# Patient Record
Sex: Female | Born: 1985 | Race: White | Hispanic: No | Marital: Married | Smoking: Never smoker
Health system: Southern US, Community
[De-identification: ages and names within clinical notes are randomized; demographics above are authoritative.]

## PROBLEM LIST (undated history)

## (undated) DIAGNOSIS — T7840XA Allergy, unspecified, initial encounter: Secondary | ICD-10-CM

## (undated) DIAGNOSIS — Z8619 Personal history of other infectious and parasitic diseases: Secondary | ICD-10-CM

## (undated) DIAGNOSIS — F419 Anxiety disorder, unspecified: Secondary | ICD-10-CM

## (undated) HISTORY — DX: Personal history of other infectious and parasitic diseases: Z86.19

## (undated) HISTORY — DX: Allergy, unspecified, initial encounter: T78.40XA

## (undated) HISTORY — DX: Anxiety disorder, unspecified: F41.9

---

## 2007-07-12 HISTORY — PX: WISDOM TOOTH EXTRACTION: SHX21

## 2013-04-17 ENCOUNTER — Ambulatory Visit (INDEPENDENT_AMBULATORY_CARE_PROVIDER_SITE_OTHER): Payer: BC Managed Care – PPO | Admitting: Obstetrics & Gynecology

## 2013-04-17 ENCOUNTER — Ambulatory Visit: Payer: Self-pay | Admitting: Obstetrics and Gynecology

## 2013-04-17 ENCOUNTER — Encounter: Payer: Self-pay | Admitting: Obstetrics & Gynecology

## 2013-04-17 VITALS — BP 126/83 | HR 91 | Resp 16 | Ht 66.0 in | Wt 132.0 lb

## 2013-04-17 DIAGNOSIS — Z124 Encounter for screening for malignant neoplasm of cervix: Secondary | ICD-10-CM

## 2013-04-17 DIAGNOSIS — Z01419 Encounter for gynecological examination (general) (routine) without abnormal findings: Secondary | ICD-10-CM | POA: Insufficient documentation

## 2013-04-17 DIAGNOSIS — N912 Amenorrhea, unspecified: Secondary | ICD-10-CM

## 2013-04-17 LAB — POCT URINE PREGNANCY: Preg Test, Ur: NEGATIVE

## 2013-04-17 NOTE — Progress Notes (Signed)
  Subjective:     Angelica Dickerson is a 27 y.o. female here for a routine exam.  Current complaints: trying to become prgnant since May.  Menses is 1 week late.  UPT is negative today.  Will order beta.  Personal health questionnaire reviewed: yes.   Gynecologic History Patient's last menstrual period was 03/10/2013. Contraception: none Last Pap: 2013. Results were: normal per pt Last mammogram:   Obstetric History OB History  Gravida Para Term Preterm AB SAB TAB Ectopic Multiple Living  0 0 0 0 0 0 0 0 0 0          The following portions of the patient's history were reviewed and updated as appropriate: allergies, current medications, past family history, past medical history, past social history, past surgical history and problem list.  Review of Systems Pertinent items are noted in HPI.    Objective:      Filed Vitals:   04/17/13 1053  BP: 126/83  Pulse: 91  Resp: 16  Height: 5\' 6"  (1.676 m)  Weight: 132 lb (59.875 kg)   Vitals:  WNL General appearance: alert, cooperative and no distress Head: Normocephalic, without obvious abnormality, atraumatic Eyes: negative Throat: lips, mucosa, and tongue normal; teeth and gums normal Lungs: clear to auscultation bilaterally Breasts: normal appearance, no masses or tenderness, No nipple retraction or dimpling, No nipple discharge or bleeding Heart: regular rate and rhythm Abdomen: soft, non-tender; bowel sounds normal; no masses,  no organomegaly Pelvic: cervix normal in appearance, external genitalia normal, no adnexal masses or tenderness, no bladder tenderness, no cervical motion tenderness, perianal skin: no external genital warts noted, urethra without abnormality or discharge, uterus normal size, shape, and consistency and vagina normal without discharge Extremities: no edema, redness or tenderness in the calves or thighs Skin: no lesions or rash Lymph nodes: Axillary adenopathy: none         Assessment:    Healthy  female exam.    Plan:    Education reviewed: self breast exams and skin cancer screening. Contraception: none. Follow up in: 1 year. PNV Pap.  Next pap due in 3 years. RTC in not pregnant in 6 months of trying to conceive. Beta today.

## 2013-04-18 ENCOUNTER — Telehealth: Payer: Self-pay | Admitting: *Deleted

## 2013-04-18 NOTE — Telephone Encounter (Signed)
Called pt to adv Quant was neg - pt to return for follow up if not pregnant in 6 months. Pt had no questions and understood test results.

## 2013-07-11 NOTE — L&D Delivery Note (Signed)
Delivery Note At 4:06 AM a viable and healthy female was delivered via Vaginal, Spontaneous Delivery (Presentation: ; Occiput Anterior).  APGAR: 8, 9; weight 7lbs 3 ozs. Placenta status: Intact, Spontaneous.  Cord: 3 vessels with the following complications: None.    Anesthesia: None  Episiotomy: None Lacerations: 2nd degree;Perineal Suture Repair: 3.0 vicryl on CT1 Est. Blood Loss (mL): 300  Mom to postpartum.  Baby to Couplet care / Skin to Skin.  ADAMS,SHNIQUAL SHWON 05/31/2014, 5:23 AM  I was present for the entire delivery and I agree with the above. Cam HaiSHAW, Kellyn Mccary CNM 9:30 AM 05/31/2014

## 2013-11-01 ENCOUNTER — Ambulatory Visit (INDEPENDENT_AMBULATORY_CARE_PROVIDER_SITE_OTHER): Payer: PRIVATE HEALTH INSURANCE | Admitting: Family

## 2013-11-01 ENCOUNTER — Encounter: Payer: Self-pay | Admitting: Family

## 2013-11-01 VITALS — BP 123/83 | HR 86 | Wt 124.0 lb

## 2013-11-01 DIAGNOSIS — Z3490 Encounter for supervision of normal pregnancy, unspecified, unspecified trimester: Secondary | ICD-10-CM

## 2013-11-01 DIAGNOSIS — Z3480 Encounter for supervision of other normal pregnancy, unspecified trimester: Secondary | ICD-10-CM

## 2013-11-01 NOTE — Progress Notes (Signed)
Pt here for first OB appt - 1st pregnancy - nrml PAP 04/2013

## 2013-11-01 NOTE — Progress Notes (Signed)
Will be in [place] 18-26 weeks, will return in September, plans to birth with the practice.    Subjective:    Angelica Dickerson is a G1P0000 5546w0d being seen today for her first obstetrical visit.   Desires midwifery care in her pregnancy. Discussed that we also utilize fellows and she was okay with one attending her birth.  Recently discovered that she's moving to [place].  Plans to go at about 19 wks until she's [redacted] weeks pregnant, will obtain care during that time.  Plans to return at 26 weeks and remain here until after the baby is born.  Patient does intend to breast feed. Pregnancy history fully reviewed.    Patient reports no bleeding and no cramping.  Filed Vitals:   11/01/13 0940  BP: 123/83  Pulse: 86  Weight: 124 lb (56.246 kg)    HISTORY: OB History  Gravida Para Term Preterm AB SAB TAB Ectopic Multiple Living  1 0 0 0 0 0 0 0 0 0     # Outcome Date GA Lbr Len/2nd Weight Sex Delivery Anes PTL Lv  1 CUR              History reviewed. No pertinent past medical history. Past Surgical History  Procedure Laterality Date  . Wisdom tooth extraction  2009   Family History  Problem Relation Age of Onset  . Diabetes Paternal Grandfather   . Hyperlipidemia Father   . Hyperlipidemia Paternal Grandfather      Exam   Exam   BP 123/83  Pulse 86  Wt 124 lb (56.246 kg)  LMP 08/23/2013 Uterine Size: size equals dates  Pelvic Exam:    Perineum: No Hemorrhoids, Normal Perineum   Vulva: normal   Vagina:  normal mucosa, normal discharge, no palpable nodules   pH: Not done   Cervix: no bleeding, no cervical motion tenderness and no lesions   Adnexa: normal adnexa and no mass, fullness, tenderness   Bony Pelvis: Adequate  System: Breast:  No nipple retraction or dimpling, No nipple discharge or bleeding, No axillary or supraclavicular adenopathy, Normal to palpation without dominant masses   Skin: normal coloration and turgor, no rashes    Neurologic: negative   Extremities: normal strength, tone, and muscle mass   HEENT neck supple with midline trachea and thyroid without masses   Mouth/Teeth mucous membranes moist, pharynx normal without lesions   Neck supple and no masses   Cardiovascular: regular rate and rhythm, no murmurs or gallops   Respiratory:  appears well, vitals normal, no respiratory distress, acyanotic, normal RR, neck free of mass or lymphadenopathy, chest clear, no wheezing, crepitations, rhonchi, normal symmetric air entry   Abdomen: soft, non-tender; bowel sounds normal; no masses,  no organomegaly   Urinary: urethral meatus normal     Assessment:    Pregnancy:  28 yo G1P0000 at 5546w0d wk by certain LMP  Patient Active Problem List   Diagnosis Date Noted  . Supervision of normal pregnancy 11/01/2013  . Routine gynecological examination 04/17/2013        Plan:     Initial labs drawn. Prenatal vitamins. Problem list reviewed and updated. Genetic Screening discussed; undecided at this time.  Follow up in 4 weeks.   Eino FarberWalidah Paul HalfN Muhammad 11/01/2013

## 2013-11-02 ENCOUNTER — Encounter: Payer: Self-pay | Admitting: Family

## 2013-11-02 DIAGNOSIS — Z2839 Other underimmunization status: Secondary | ICD-10-CM | POA: Insufficient documentation

## 2013-11-02 DIAGNOSIS — Z283 Underimmunization status: Secondary | ICD-10-CM | POA: Insufficient documentation

## 2013-11-02 DIAGNOSIS — O9989 Other specified diseases and conditions complicating pregnancy, childbirth and the puerperium: Secondary | ICD-10-CM

## 2013-11-02 LAB — OBSTETRIC PANEL
Antibody Screen: NEGATIVE
BASOS ABS: 0 10*3/uL (ref 0.0–0.1)
Basophils Relative: 0 % (ref 0–1)
Eosinophils Absolute: 0 10*3/uL (ref 0.0–0.7)
Eosinophils Relative: 0 % (ref 0–5)
HCT: 39.4 % (ref 36.0–46.0)
HEP B S AG: NEGATIVE
Hemoglobin: 13.8 g/dL (ref 12.0–15.0)
Lymphocytes Relative: 19 % (ref 12–46)
Lymphs Abs: 1.7 10*3/uL (ref 0.7–4.0)
MCH: 30.4 pg (ref 26.0–34.0)
MCHC: 35 g/dL (ref 30.0–36.0)
MCV: 86.8 fL (ref 78.0–100.0)
Monocytes Absolute: 0.4 10*3/uL (ref 0.1–1.0)
Monocytes Relative: 4 % (ref 3–12)
NEUTROS ABS: 6.9 10*3/uL (ref 1.7–7.7)
NEUTROS PCT: 77 % (ref 43–77)
Platelets: 207 10*3/uL (ref 150–400)
RBC: 4.54 MIL/uL (ref 3.87–5.11)
RDW: 13.9 % (ref 11.5–15.5)
RUBELLA: 0.81 {index} (ref <0.90)
Rh Type: POSITIVE
WBC: 8.9 10*3/uL (ref 4.0–10.5)

## 2013-11-02 LAB — GC/CHLAMYDIA PROBE AMP
CT Probe RNA: NEGATIVE
GC Probe RNA: NEGATIVE

## 2013-11-02 LAB — HIV ANTIBODY (ROUTINE TESTING W REFLEX): HIV: NONREACTIVE

## 2013-11-04 LAB — CULTURE, OB URINE

## 2013-11-09 ENCOUNTER — Other Ambulatory Visit: Payer: Self-pay | Admitting: Family

## 2013-11-09 ENCOUNTER — Encounter: Payer: Self-pay | Admitting: Family

## 2013-11-09 MED ORDER — ERYTHROMYCIN BASE 333 MG PO TBEC: 333.0000 mg | DELAYED_RELEASE_TABLET | Freq: Three times a day (TID) | ORAL | Status: DC

## 2013-11-09 NOTE — Progress Notes (Signed)
Left message on voicemail for patient to call office.  When call is returned inform her that she has a UTI and [place] [facility] [place]

## 2013-11-29 ENCOUNTER — Ambulatory Visit (INDEPENDENT_AMBULATORY_CARE_PROVIDER_SITE_OTHER): Payer: PRIVATE HEALTH INSURANCE | Admitting: Family

## 2013-11-29 VITALS — BP 132/78 | HR 100 | Wt 124.0 lb

## 2013-11-29 DIAGNOSIS — Z3480 Encounter for supervision of other normal pregnancy, unspecified trimester: Secondary | ICD-10-CM

## 2013-11-29 DIAGNOSIS — Z3490 Encounter for supervision of normal pregnancy, unspecified, unspecified trimester: Secondary | ICD-10-CM

## 2013-11-29 NOTE — Progress Notes (Signed)
Right hip pain; no vaginal bleeding, no cramping or bleeding.  Stretching has improved.  Recommended pregnancy yoga.  Considering waterbirth.  Explained needing to take waterbirth class at [facility].  Schedule anatomy ultrasound in 4 weeks.

## 2013-11-29 NOTE — Progress Notes (Signed)
Pt c/o right hip pain radiating to lower back - worse with sitting - pain has been relieved some by swimming and stretching

## 2013-12-20 ENCOUNTER — Telehealth: Payer: Self-pay | Admitting: *Deleted

## 2013-12-20 NOTE — Telephone Encounter (Signed)
Pt called in states she has a bright pink rash that recently came up. She states she itches a lot. She is not sure what the cause is for the rash. I adv pt to report to [facility] for eval and that the rash would need to be seen before a medication could be sent to pharm - pt expressed understanding.

## 2013-12-27 ENCOUNTER — Ambulatory Visit (INDEPENDENT_AMBULATORY_CARE_PROVIDER_SITE_OTHER): Payer: PRIVATE HEALTH INSURANCE | Admitting: Advanced Practice Midwife

## 2013-12-27 ENCOUNTER — Ambulatory Visit (HOSPITAL_COMMUNITY)
Admission: RE | Admit: 2013-12-27 | Discharge: 2013-12-27 | Disposition: A | Discharge status: Home / Self Care | Payer: PRIVATE HEALTH INSURANCE | Source: Ambulatory Visit | Attending: Family | Admitting: Family

## 2013-12-27 ENCOUNTER — Other Ambulatory Visit: Payer: Self-pay | Admitting: Family

## 2013-12-27 VITALS — BP 131/87 | HR 95 | Wt 127.0 lb

## 2013-12-27 DIAGNOSIS — O2390 Unspecified genitourinary tract infection in pregnancy, unspecified trimester: Secondary | ICD-10-CM

## 2013-12-27 DIAGNOSIS — Z3490 Encounter for supervision of normal pregnancy, unspecified, unspecified trimester: Secondary | ICD-10-CM

## 2013-12-27 DIAGNOSIS — B951 Streptococcus, group B, as the cause of diseases classified elsewhere: Secondary | ICD-10-CM

## 2013-12-27 DIAGNOSIS — O2340 Unspecified infection of urinary tract in pregnancy, unspecified trimester: Secondary | ICD-10-CM

## 2013-12-27 DIAGNOSIS — O2341 Unspecified infection of urinary tract in pregnancy, first trimester: Principal | ICD-10-CM

## 2013-12-27 DIAGNOSIS — N390 Urinary tract infection, site not specified: Secondary | ICD-10-CM

## 2013-12-27 DIAGNOSIS — Z3480 Encounter for supervision of other normal pregnancy, unspecified trimester: Secondary | ICD-10-CM

## 2013-12-27 DIAGNOSIS — Z3689 Encounter for other specified antenatal screening: Secondary | ICD-10-CM | POA: Insufficient documentation

## 2013-12-27 NOTE — Patient Instructions (Signed)
Second Trimester of Pregnancy The second trimester is from week 13 through week 28, months 4 through 6. The second trimester is often a time when you feel your best. Your body has also adjusted to being pregnant, and you begin to feel better physically. Usually, morning sickness has lessened or quit completely, you may have more energy, and you may have an increase in appetite. The second trimester is also a time when the fetus is growing rapidly. At the end of the sixth month, the fetus is about 9 inches long and weighs about 1 pounds. You will likely begin to feel the baby move (quickening) between 18 and 20 weeks of the pregnancy. BODY CHANGES Your body goes through many changes during pregnancy. The changes vary from woman to woman.   Your weight will continue to increase. You will notice your lower abdomen bulging out.  You may begin to get stretch marks on your hips, abdomen, and breasts.  You may develop headaches that can be relieved by medicines approved by your health care Angelica Dickerson.  You may urinate more often because the fetus is pressing on your bladder.  You may develop or continue to have heartburn as a result of your pregnancy.  You may develop constipation because certain hormones are causing the muscles that push waste through your intestines to slow down.  You may develop hemorrhoids or swollen, bulging veins (varicose veins).  You may have back pain because of the weight gain and pregnancy hormones relaxing your joints between the bones in your pelvis and as a result of a shift in weight and the muscles that support your balance.  Your breasts will continue to grow and be tender.  Your gums may bleed and may be sensitive to brushing and flossing.  Dark spots or blotches (chloasma, mask of pregnancy) may develop on your face. This will likely fade after the baby is born.  A dark line from your belly button to the pubic area (linea nigra) may appear. This will likely fade  after the baby is born.  You may have changes in your hair. These can include thickening of your hair, rapid growth, and changes in texture. Some women also have hair loss during or after pregnancy, or hair that feels dry or thin. Your hair will most likely return to normal after your baby is born. WHAT TO EXPECT AT YOUR PRENATAL VISITS During a routine prenatal visit:  You will be weighed to make sure you and the fetus are growing normally.  Your blood pressure will be taken.  Your abdomen will be measured to track your baby's growth.  The fetal heartbeat will be listened to.  Any test results from the previous visit will be discussed. Your health care Angelica Dickerson may ask you:  How you are feeling.  If you are feeling the baby move.  If you have had any abnormal symptoms, such as leaking fluid, bleeding, severe headaches, or abdominal cramping.  If you have any questions. Other tests that may be performed during your second trimester include:  Blood tests that check for:  Low iron levels (anemia).  Gestational diabetes (between 24 and 28 weeks).  Rh antibodies.  Urine tests to check for infections, diabetes, or protein in the urine.  An ultrasound to confirm the proper growth and development of the baby.  An amniocentesis to check for possible genetic problems.  Fetal screens for spina bifida and Down syndrome. HOME CARE INSTRUCTIONS   Avoid all smoking, herbs, alcohol, and unprescribed   drugs. These chemicals affect the formation and growth of the baby.  Follow your health care Angelica Dickerson's instructions regarding medicine use. There are medicines that are either safe or unsafe to take during pregnancy.  Exercise only as directed by your health care Angelica Dickerson. Experiencing uterine cramps is a good sign to stop exercising.  Continue to eat regular, healthy meals.  Wear a good support bra for breast tenderness.  Do not use hot tubs, steam rooms, or saunas.  Wear your  seat belt at all times when driving.  Avoid raw meat, uncooked cheese, cat litter boxes, and soil used by cats. These carry germs that can cause birth defects in the baby.  Take your prenatal vitamins.  Try taking a stool softener (if your health care Angelica Dickerson approves) if you develop constipation. Eat more high-fiber foods, such as fresh vegetables or fruit and whole grains. Drink plenty of fluids to keep your urine clear or pale yellow.  Take warm sitz baths to soothe any pain or discomfort caused by hemorrhoids. Use hemorrhoid cream if your health care Angelica Dickerson approves.  If you develop varicose veins, wear support hose. Elevate your feet for 15 minutes, 3-4 times a day. Limit salt in your diet.  Avoid heavy lifting, wear low heel shoes, and practice good posture.  Rest with your legs elevated if you have leg cramps or low back pain.  Visit your dentist if you have not gone yet during your pregnancy. Use a soft toothbrush to brush your teeth and be gentle when you floss.  A sexual relationship may be continued unless your health care Angelica Dickerson directs you otherwise.  Continue to go to all your prenatal visits as directed by your health care Angelica Dickerson. SEEK MEDICAL CARE IF:   You have dizziness.  You have mild pelvic cramps, pelvic pressure, or nagging pain in the abdominal area.  You have persistent nausea, vomiting, or diarrhea.  You have a bad smelling vaginal discharge.  You have pain with urination. SEEK IMMEDIATE MEDICAL CARE IF:   You have a fever.  You are leaking fluid from your vagina.  You have spotting or bleeding from your vagina.  You have severe abdominal cramping or pain.  You have rapid weight gain or loss.  You have shortness of breath with chest pain.  You notice sudden or extreme swelling of your face, hands, ankles, feet, or legs.  You have not felt your baby move in over an hour.  You have severe headaches that do not go away with  medicine.  You have vision changes. Document Released: 06/21/2001 Document Revised: 07/02/2013 Document Reviewed: 08/28/2012 ExitCare Patient Information 2015 [employer]. This information is not intended to replace advice given to you by your health care Angelica Dickerson. Make sure you discuss any questions you have with your health care Angelica Dickerson.  

## 2013-12-27 NOTE — Progress Notes (Signed)
Anatomy US done today, not read yet. States tech told her the ventricles might be small. Going to [place] next week. Will leave husband's phone number. If needs extra US, may get it there or here in Sept when returns. Has taken waterbirth class. Certificate scanned today. Travel precautions reviewed.

## 2013-12-30 ENCOUNTER — Encounter: Payer: Self-pay | Admitting: Family

## 2013-12-30 ENCOUNTER — Telehealth: Payer: Self-pay | Admitting: *Deleted

## 2013-12-30 NOTE — Telephone Encounter (Signed)
Message copied by Arne ClevelandHUTCHINSON, MANDY J on Mon Dec 30, 2013  3:00 PM ------      Message from: Aviva SignsWILLIAMS, MARIE L      Created: Fri Dec 27, 2013 11:47 AM      Regarding: GBS UTI       Not clear if this UTI ever got treated.            If not needs ampicillin 500mg  qid x 7 d            Hilda LiasMarie ------

## 2013-12-30 NOTE — Telephone Encounter (Signed)
Error

## 2014-01-01 ENCOUNTER — Encounter: Payer: Self-pay | Admitting: Obstetrics & Gynecology

## 2014-01-01 ENCOUNTER — Telehealth: Payer: Self-pay | Admitting: *Deleted

## 2014-01-01 DIAGNOSIS — A491 Streptococcal infection, unspecified site: Secondary | ICD-10-CM

## 2014-01-01 MED ORDER — CLINDAMYCIN HCL 300 MG PO CAPS: 300.0000 mg | ORAL_CAPSULE | Freq: Four times a day (QID) | ORAL | Status: DC

## 2014-01-01 NOTE — Telephone Encounter (Signed)
Called pt but can't leave message - sent script for GBS UTI to pharm

## 2014-01-01 NOTE — Telephone Encounter (Signed)
Message copied by HUTCHINSON, MANDY J on Wed Jan 01, 2014 10:47 AM ------      Message from: Aviva SigArne ClevelandnsWILLIAMS, MARIE L      Created: Mon Dec 30, 2013  4:54 PM      Regarding: RE: GBS UTI       Since it is GBS, then Clindamycin 300mg  q 6 hrs  X 7 days            Amp is too close to [facility]                        ----- Message -----         From: Arne ClevelandMandy J Hutchinson, CMA         Sent: 12/30/2013   3:16 PM           To: Blossom HoopsLora Lee Clark, RN, Aviva SignsMarie L Williams, CNM      Subject: RE: GBS UTI                                              Pt is allergic to Amoxicillin, do you want to call in ampicillin or something else?             Thanks      MH                  ----- Message -----         From: Aviva SignsMarie L Williams, CNM         Sent: 12/27/2013  11:47 AM           To: Arne ClevelandMandy J Hutchinson, CMA      Subject: GBS UTI                                                  Not clear if this UTI ever got treated.            If not needs ampicillin 500mg  qid x 7 d            Hilda LiasMarie             ------

## 2014-01-02 ENCOUNTER — Other Ambulatory Visit: Payer: Self-pay | Admitting: *Deleted

## 2014-01-02 DIAGNOSIS — A491 Streptococcal infection, unspecified site: Secondary | ICD-10-CM

## 2014-01-02 MED ORDER — CLINDAMYCIN HCL 300 MG PO CAPS: 300.0000 mg | ORAL_CAPSULE | Freq: Four times a day (QID) | ORAL | Status: DC

## 2014-01-02 NOTE — Telephone Encounter (Signed)
Pt is in [place] until September and is requesting her RX for Clindamycin be emailed to her.

## 2014-03-24 ENCOUNTER — Ambulatory Visit (INDEPENDENT_AMBULATORY_CARE_PROVIDER_SITE_OTHER): Payer: No Typology Code available for payment source | Admitting: Family

## 2014-03-24 ENCOUNTER — Encounter: Payer: Self-pay | Admitting: Family

## 2014-03-24 VITALS — BP 109/68 | HR 81 | Wt 133.8 lb

## 2014-03-24 DIAGNOSIS — Z3480 Encounter for supervision of other normal pregnancy, unspecified trimester: Secondary | ICD-10-CM

## 2014-03-24 DIAGNOSIS — Z3493 Encounter for supervision of normal pregnancy, unspecified, third trimester: Secondary | ICD-10-CM

## 2014-03-24 NOTE — Progress Notes (Signed)
While in [place] was told she had some cervical shortening from 36cm to 27cm and has held at the 27cm point since.  She also had her glucose testing done while there, they did a three hour and was told it was normal as well.  She did bring these results back with her.

## 2014-03-24 NOTE — Progress Notes (Signed)
Recently returned from [place].  Reported that transvaginal and abdominal ultrasound done at every visit there.  Informed that had short cervix 2.7 cm, not put on meds.  Cervix closed, no history of contractions or vaginal bleedign.  RPR, CBC, and HIV obtained.  2 hr in [place] normal [phone] (brought records).  Report any signs of preterm labor.

## 2014-03-25 LAB — CBC
HEMATOCRIT: 35.7 % — AB (ref 36.0–46.0)
Hemoglobin: 11.9 g/dL — ABNORMAL LOW (ref 12.0–15.0)
MCH: 31.2 pg (ref 26.0–34.0)
MCHC: 33.3 g/dL (ref 30.0–36.0)
MCV: 93.5 fL (ref 78.0–100.0)
Platelets: 141 10*3/uL — ABNORMAL LOW (ref 150–400)
RBC: 3.82 MIL/uL — AB (ref 3.87–5.11)
RDW: 13.8 % (ref 11.5–15.5)
WBC: 8.4 10*3/uL (ref 4.0–10.5)

## 2014-03-25 LAB — RPR

## 2014-03-25 LAB — HIV ANTIBODY (ROUTINE TESTING W REFLEX): HIV 1&2 Ab, 4th Generation: NONREACTIVE

## 2014-04-09 ENCOUNTER — Ambulatory Visit (INDEPENDENT_AMBULATORY_CARE_PROVIDER_SITE_OTHER): Payer: No Typology Code available for payment source | Admitting: Obstetrics & Gynecology

## 2014-04-09 VITALS — BP 115/68 | HR 84 | Wt 139.0 lb

## 2014-04-09 DIAGNOSIS — Z23 Encounter for immunization: Secondary | ICD-10-CM

## 2014-04-09 DIAGNOSIS — Z3493 Encounter for supervision of normal pregnancy, unspecified, third trimester: Secondary | ICD-10-CM

## 2014-04-09 DIAGNOSIS — Z3480 Encounter for supervision of other normal pregnancy, unspecified trimester: Secondary | ICD-10-CM

## 2014-04-09 MED ORDER — INFLUENZA VAC SPLIT QUAD 0.5 ML IM SUSY
0.5000 mL | PREFILLED_SYRINGE | Freq: Once | INTRAMUSCULAR | Status: AC
  Administered 2014-04-09: 0.5 mL via INTRAMUSCULAR

## 2014-04-09 NOTE — Progress Notes (Signed)
Flu shot this week.  Tdap next visit.  Size less than dates.  Will get [place].  Wants water birth.  Has taken the class.

## 2014-04-09 NOTE — Addendum Note (Signed)
Addended by: Granville LewisLARK, Della Homan L on: 04/09/2014 09:08 AM   Modules accepted: Orders

## 2014-04-10 ENCOUNTER — Ambulatory Visit (HOSPITAL_COMMUNITY)
Admission: RE | Admit: 2014-04-10 | Discharge: 2014-04-10 | Disposition: A | Discharge status: Home / Self Care | Payer: 59 | Source: Ambulatory Visit | Attending: Obstetrics & Gynecology | Admitting: Obstetrics & Gynecology

## 2014-04-10 DIAGNOSIS — Z3A32 32 weeks gestation of pregnancy: Secondary | ICD-10-CM

## 2014-04-10 DIAGNOSIS — Z3493 Encounter for supervision of normal pregnancy, unspecified, third trimester: Secondary | ICD-10-CM | POA: Insufficient documentation

## 2014-04-10 DIAGNOSIS — Z0489 Encounter for examination and observation for other specified reasons: Secondary | ICD-10-CM

## 2014-04-10 DIAGNOSIS — O365930 Maternal care for other known or suspected poor fetal growth, third trimester, not applicable or unspecified: Secondary | ICD-10-CM

## 2014-04-10 DIAGNOSIS — IMO0002 Reserved for concepts with insufficient information to code with codable children: Secondary | ICD-10-CM

## 2014-04-21 ENCOUNTER — Ambulatory Visit (INDEPENDENT_AMBULATORY_CARE_PROVIDER_SITE_OTHER): Payer: 59 | Admitting: Advanced Practice Midwife

## 2014-04-21 ENCOUNTER — Encounter: Payer: Self-pay | Admitting: *Deleted

## 2014-04-21 VITALS — BP 131/76 | HR 83 | Wt 139.0 lb

## 2014-04-21 DIAGNOSIS — Z3480 Encounter for supervision of other normal pregnancy, unspecified trimester: Secondary | ICD-10-CM

## 2014-04-21 DIAGNOSIS — Z23 Encounter for immunization: Secondary | ICD-10-CM | POA: Diagnosis not present

## 2014-04-21 MED ORDER — TETANUS-DIPHTH-ACELL PERTUSSIS 5-2.5-18.5 LF-MCG/0.5 IM SUSP: 0.5000 mL | Freq: Once | INTRAMUSCULAR | Status: DC

## 2014-04-21 NOTE — Progress Notes (Signed)
Doing well.  Good fetal movement, denies vaginal bleeding, LOF, regular contractions.  Waterbirth consent/research study discussed and signed today.

## 2014-05-05 ENCOUNTER — Ambulatory Visit (INDEPENDENT_AMBULATORY_CARE_PROVIDER_SITE_OTHER): Payer: 59 | Admitting: Advanced Practice Midwife

## 2014-05-05 ENCOUNTER — Other Ambulatory Visit: Payer: Self-pay | Admitting: Advanced Practice Midwife

## 2014-05-05 ENCOUNTER — Encounter: Payer: Self-pay | Admitting: Advanced Practice Midwife

## 2014-05-05 VITALS — BP 125/76 | HR 80 | Wt 140.0 lb

## 2014-05-05 DIAGNOSIS — Z36 Encounter for antenatal screening of mother: Secondary | ICD-10-CM

## 2014-05-05 DIAGNOSIS — Z3493 Encounter for supervision of normal pregnancy, unspecified, third trimester: Secondary | ICD-10-CM

## 2014-05-05 LAB — OB RESULTS CONSOLE GC/CHLAMYDIA
Chlamydia: NEGATIVE
Gonorrhea: NEGATIVE

## 2014-05-05 LAB — OB RESULTS CONSOLE GBS: STREP GROUP B AG: POSITIVE

## 2014-05-05 NOTE — Patient Instructions (Signed)

## 2014-05-05 NOTE — Progress Notes (Signed)
GBS done. Using [facility] for waterbirth. Declines cervical exam

## 2014-05-06 LAB — GC/CHLAMYDIA PROBE AMP
CT PROBE, AMP APTIMA: NEGATIVE
GC Probe RNA: NEGATIVE

## 2014-05-06 LAB — CULTURE, BETA STREP (GROUP B ONLY)

## 2014-05-07 ENCOUNTER — Encounter (HOSPITAL_COMMUNITY): Payer: Self-pay | Admitting: Advanced Practice Midwife

## 2014-05-12 ENCOUNTER — Encounter: Payer: Self-pay | Admitting: Advanced Practice Midwife

## 2014-05-16 ENCOUNTER — Ambulatory Visit (INDEPENDENT_AMBULATORY_CARE_PROVIDER_SITE_OTHER): Payer: 59 | Admitting: Family

## 2014-05-16 VITALS — BP 132/81 | HR 87 | Wt 142.0 lb

## 2014-05-16 DIAGNOSIS — Z3493 Encounter for supervision of normal pregnancy, unspecified, third trimester: Secondary | ICD-10-CM

## 2014-05-16 NOTE — Progress Notes (Signed)
Doing well; excited to have baby.  Reviewed signs of labor.  Discussed waterbirth and inability to be in water if continuous monitoring needed.

## 2014-05-23 ENCOUNTER — Ambulatory Visit (INDEPENDENT_AMBULATORY_CARE_PROVIDER_SITE_OTHER): Payer: 59 | Admitting: Advanced Practice Midwife

## 2014-05-23 VITALS — BP 132/79 | HR 93 | Wt 145.0 lb

## 2014-05-23 DIAGNOSIS — B951 Streptococcus, group B, as the cause of diseases classified elsewhere: Secondary | ICD-10-CM

## 2014-05-23 DIAGNOSIS — Z3493 Encounter for supervision of normal pregnancy, unspecified, third trimester: Secondary | ICD-10-CM

## 2014-05-23 DIAGNOSIS — Z3403 Encounter for supervision of normal first pregnancy, third trimester: Secondary | ICD-10-CM

## 2014-05-23 DIAGNOSIS — O2343 Unspecified infection of urinary tract in pregnancy, third trimester: Secondary | ICD-10-CM

## 2014-05-23 NOTE — Patient Instructions (Signed)
Braxton Hicks Contractions °Contractions of the uterus can occur throughout pregnancy. Contractions are not always a sign that you are in labor.  °WHAT ARE BRAXTON HICKS CONTRACTIONS?  °Contractions that occur before labor are called Braxton Hicks contractions, or false labor. Toward the end of pregnancy (32-34 weeks), these contractions can develop more often and may become more forceful. This is not true labor because these contractions do not result in opening (dilatation) and thinning of the cervix. They are sometimes difficult to tell apart from true labor because these contractions can be forceful and people have different pain tolerances. You should not feel embarrassed if you go to the hospital with false labor. Sometimes, the only way to tell if you are in true labor is for your health care provider to look for changes in the cervix. °If there are no prenatal problems or other health problems associated with the pregnancy, it is completely safe to be sent home with false labor and await the onset of true labor. °HOW CAN YOU TELL THE DIFFERENCE BETWEEN TRUE AND FALSE LABOR? °False Labor °· The contractions of false labor are usually shorter and not as hard as those of true labor.   °· The contractions are usually irregular.   °· The contractions are often felt in the front of the lower abdomen and in the groin.   °· The contractions may go away when you walk around or change positions while lying down.   °· The contractions get weaker and are shorter lasting as time goes on.   °· The contractions do not usually become progressively stronger, regular, and closer together as with true labor.   °True Labor °1. Contractions in true labor last 30-70 seconds, become very regular, usually become more intense, and increase in frequency.   °2. The contractions do not go away with walking.   °3. The discomfort is usually felt in the top of the uterus and spreads to the lower abdomen and low back.   °4. True labor can  be determined by your health care provider with an exam. This will show that the cervix is dilating and getting thinner.   °WHAT TO REMEMBER °· Keep up with your usual exercises and follow other instructions given by your health care provider.   °· Take medicines as directed by your health care provider.   °· Keep your regular prenatal appointments.   °· Eat and drink lightly if you think you are going into labor.   °· If Braxton Hicks contractions are making you uncomfortable:   °· Change your position from lying down or resting to walking, or from walking to resting.   °· Sit and rest in a tub of warm water.   °· Drink 2-3 glasses of water. Dehydration may cause these contractions.   °· Do slow and deep breathing several times an hour.   °WHEN SHOULD I SEEK IMMEDIATE MEDICAL CARE? °Seek immediate medical care if: °· Your contractions become stronger, more regular, and closer together.   °· You have fluid leaking or gushing from your vagina.   °· You have a fever.   °· You pass blood-tinged mucus.   °· You have vaginal bleeding.   °· You have continuous abdominal pain.   °· You have low back pain that you never had before.   °· You feel your baby's head pushing down and causing pelvic pressure.   °· Your baby is not moving as much as it used to.   °Document Released: 06/27/2005 Document Revised: 07/02/2013 Document Reviewed: 04/08/2013 °Exit[employer]® Patient Information ©2015 [employer]. This information is not intended to replace advice given to you by your health care   provider. Make sure you discuss any questions you have with your health care provider. ° °Fetal Movement Counts °Patient Name: __________________________________________________ Patient Due Date: ____________________ °Performing a fetal movement count is highly recommended in high-risk pregnancies, but it is good for every pregnant woman to do. Your health care provider may ask you to start counting fetal movements at 28 weeks of the pregnancy. Fetal  movements often increase: °· After eating a full meal. °· After physical activity. °· After eating or drinking something sweet or cold. °· At rest. °Pay attention to when you feel the baby is most active. This will help you notice a pattern of your baby's sleep and wake cycles and what factors contribute to an increase in fetal movement. It is important to perform a fetal movement count at the same time each day when your baby is normally most active.  °HOW TO COUNT FETAL MOVEMENTS °5. Find a quiet and comfortable area to sit or lie down on your left side. Lying on your left side provides the best blood and oxygen circulation to your baby. °6. Write down the day and time on a sheet of paper or in a journal. °7. Start counting kicks, flutters, swishes, rolls, or jabs in a 2-hour period. You should feel at least 10 movements within 2 hours. °8. If you do not feel 10 movements in 2 hours, wait 2-3 hours and count again. Look for a change in the pattern or not enough counts in 2 hours. °SEEK MEDICAL CARE IF: °· You feel less than 10 counts in 2 hours, tried twice. °· There is no movement in over an hour. °· The pattern is changing or taking longer each day to reach 10 counts in 2 hours. °· You feel the baby is not moving as he or she usually does. °Date: ____________ Movements: ____________ Start time: ____________ Finish time: ____________  °Date: ____________ Movements: ____________ Start time: ____________ Finish time: ____________ °Date: ____________ Movements: ____________ Start time: ____________ Finish time: ____________ °Date: ____________ Movements: ____________ Start time: ____________ Finish time: ____________ °Date: ____________ Movements: ____________ Start time: ____________ Finish time: ____________ °Date: ____________ Movements: ____________ Start time: ____________ Finish time: ____________ °Date: ____________ Movements: ____________ Start time: ____________ Finish time: ____________ °Date: ____________  Movements: ____________ Start time: ____________ Finish time: ____________  °Date: ____________ Movements: ____________ Start time: ____________ Finish time: ____________ °Date: ____________ Movements: ____________ Start time: ____________ Finish time: ____________ °Date: ____________ Movements: ____________ Start time: ____________ Finish time: ____________ °Date: ____________ Movements: ____________ Start time: ____________ Finish time: ____________ °Date: ____________ Movements: ____________ Start time: ____________ Finish time: ____________ °Date: ____________ Movements: ____________ Start time: ____________ Finish time: ____________ °Date: ____________ Movements: ____________ Start time: ____________ Finish time: ____________  °Date: ____________ Movements: ____________ Start time: ____________ Finish time: ____________ °Date: ____________ Movements: ____________ Start time: ____________ Finish time: ____________ °Date: ____________ Movements: ____________ Start time: ____________ Finish time: ____________ °Date: ____________ Movements: ____________ Start time: ____________ Finish time: ____________ °Date: ____________ Movements: ____________ Start time: ____________ Finish time: ____________ °Date: ____________ Movements: ____________ Start time: ____________ Finish time: ____________ °Date: ____________ Movements: ____________ Start time: ____________ Finish time: ____________  °Date: ____________ Movements: ____________ Start time: ____________ Finish time: ____________ °Date: ____________ Movements: ____________ Start time: ____________ Finish time: ____________ °Date: ____________ Movements: ____________ Start time: ____________ Finish time: ____________ °Date: ____________ Movements: ____________ Start time: ____________ Finish time: ____________ °Date: ____________ Movements: ____________ Start time: ____________ Finish time: ____________ °Date: ____________ Movements: ____________ Start time:  ____________ Finish time: ____________ °Date: ____________ Movements:   ____________ Start time: ____________ Finish time: ____________  °Date: ____________ Movements: ____________ Start time: ____________ Finish time: ____________ °Date: ____________ Movements: ____________ Start time: ____________ Finish time: ____________ °Date: ____________ Movements: ____________ Start time: ____________ Finish time: ____________ °Date: ____________ Movements: ____________ Start time: ____________ Finish time: ____________ °Date: ____________ Movements: ____________ Start time: ____________ Finish time: ____________ °Date: ____________ Movements: ____________ Start time: ____________ Finish time: ____________ °Date: ____________ Movements: ____________ Start time: ____________ Finish time: ____________  °Date: ____________ Movements: ____________ Start time: ____________ Finish time: ____________ °Date: ____________ Movements: ____________ Start time: ____________ Finish time: ____________ °Date: ____________ Movements: ____________ Start time: ____________ Finish time: ____________ °Date: ____________ Movements: ____________ Start time: ____________ Finish time: ____________ °Date: ____________ Movements: ____________ Start time: ____________ Finish time: ____________ °Date: ____________ Movements: ____________ Start time: ____________ Finish time: ____________ °Date: ____________ Movements: ____________ Start time: ____________ Finish time: ____________  °Date: ____________ Movements: ____________ Start time: ____________ Finish time: ____________ °Date: ____________ Movements: ____________ Start time: ____________ Finish time: ____________ °Date: ____________ Movements: ____________ Start time: ____________ Finish time: ____________ °Date: ____________ Movements: ____________ Start time: ____________ Finish time: ____________ °Date: ____________ Movements: ____________ Start time: ____________ Finish time: ____________ °Date:  ____________ Movements: ____________ Start time: ____________ Finish time: ____________ °Date: ____________ Movements: ____________ Start time: ____________ Finish time: ____________  °Date: ____________ Movements: ____________ Start time: ____________ Finish time: ____________ °Date: ____________ Movements: ____________ Start time: ____________ Finish time: ____________ °Date: ____________ Movements: ____________ Start time: ____________ Finish time: ____________ °Date: ____________ Movements: ____________ Start time: ____________ Finish time: ____________ °Date: ____________ Movements: ____________ Start time: ____________ Finish time: ____________ °Date: ____________ Movements: ____________ Start time: ____________ Finish time: ____________ °Document Released: 07/27/2006 Document Revised: 11/11/2013 Document Reviewed: 04/23/2012 °[employer]® Patient Information ©2015 [employer]. This information is not intended to replace advice given to you by your health care provider. Make sure you discuss any questions you have with your health care provider. ° °

## 2014-05-23 NOTE — Progress Notes (Signed)
Discussed GBS ABX. Had significant hives w/ -cillins. Sensitivities not done.

## 2014-05-23 NOTE — Progress Notes (Signed)
Does not ned cervix checked today

## 2014-05-30 ENCOUNTER — Ambulatory Visit (INDEPENDENT_AMBULATORY_CARE_PROVIDER_SITE_OTHER): Payer: 59 | Admitting: Advanced Practice Midwife

## 2014-05-30 VITALS — BP 131/86 | HR 77 | Wt 146.0 lb

## 2014-05-30 DIAGNOSIS — O26843 Uterine size-date discrepancy, third trimester: Secondary | ICD-10-CM

## 2014-05-30 DIAGNOSIS — O480 Post-term pregnancy: Secondary | ICD-10-CM

## 2014-05-30 DIAGNOSIS — Z3403 Encounter for supervision of normal first pregnancy, third trimester: Secondary | ICD-10-CM

## 2014-05-30 NOTE — Progress Notes (Signed)
Active baby. Increased BH. Fundal height low today. Baby engaged in pelvis. AFI normal today. Growth [place] ordered. Pt was 6-7 at birth.

## 2014-05-31 ENCOUNTER — Inpatient Hospital Stay (HOSPITAL_COMMUNITY)
Admission: AD | Admit: 2014-05-31 | Discharge: 2014-06-02 | DRG: 775 | Disposition: A | Discharge status: Home / Self Care | Payer: 59 | Source: Ambulatory Visit | Attending: Obstetrics & Gynecology | Admitting: Obstetrics & Gynecology

## 2014-05-31 ENCOUNTER — Encounter (HOSPITAL_COMMUNITY): Payer: Self-pay | Admitting: *Deleted

## 2014-05-31 DIAGNOSIS — B951 Streptococcus, group B, as the cause of diseases classified elsewhere: Secondary | ICD-10-CM

## 2014-05-31 DIAGNOSIS — O9989 Other specified diseases and conditions complicating pregnancy, childbirth and the puerperium: Secondary | ICD-10-CM

## 2014-05-31 DIAGNOSIS — Z3493 Encounter for supervision of normal pregnancy, unspecified, third trimester: Secondary | ICD-10-CM

## 2014-05-31 DIAGNOSIS — O4292 Full-term premature rupture of membranes, unspecified as to length of time between rupture and onset of labor: Secondary | ICD-10-CM | POA: Diagnosis present

## 2014-05-31 DIAGNOSIS — Z3A40 40 weeks gestation of pregnancy: Secondary | ICD-10-CM | POA: Diagnosis present

## 2014-05-31 DIAGNOSIS — O99824 Streptococcus B carrier state complicating childbirth: Secondary | ICD-10-CM | POA: Diagnosis present

## 2014-05-31 DIAGNOSIS — O09899 Supervision of other high risk pregnancies, unspecified trimester: Secondary | ICD-10-CM

## 2014-05-31 DIAGNOSIS — IMO0001 Reserved for inherently not codable concepts without codable children: Secondary | ICD-10-CM

## 2014-05-31 DIAGNOSIS — O2343 Unspecified infection of urinary tract in pregnancy, third trimester: Secondary | ICD-10-CM

## 2014-05-31 DIAGNOSIS — Z283 Underimmunization status: Secondary | ICD-10-CM

## 2014-05-31 LAB — CBC
HCT: 40.5 % (ref 36.0–46.0)
HEMOGLOBIN: 14 g/dL (ref 12.0–15.0)
MCH: 32.1 pg (ref 26.0–34.0)
MCHC: 34.6 g/dL (ref 30.0–36.0)
MCV: 92.9 fL (ref 78.0–100.0)
Platelets: 120 10*3/uL — ABNORMAL LOW (ref 150–400)
RBC: 4.36 MIL/uL (ref 3.87–5.11)
RDW: 13.2 % (ref 11.5–15.5)
WBC: 14.7 10*3/uL — AB (ref 4.0–10.5)

## 2014-05-31 LAB — TYPE AND SCREEN
ABO/RH(D): O POS
ANTIBODY SCREEN: NEGATIVE

## 2014-05-31 LAB — ABO/RH: ABO/RH(D): O POS

## 2014-05-31 LAB — RPR

## 2014-05-31 MED ORDER — OXYCODONE-ACETAMINOPHEN 5-325 MG PO TABS
1.0000 | ORAL_TABLET | ORAL | Status: DC | PRN
Start: 2014-05-31 — End: 2014-06-02

## 2014-05-31 MED ORDER — ONDANSETRON HCL 4 MG/2ML IJ SOLN: 4.0000 mg | INTRAMUSCULAR | Status: DC | PRN

## 2014-05-31 MED ORDER — PRENATAL MULTIVITAMIN CH
1.0000 | ORAL_TABLET | Freq: Every day | ORAL | Status: DC
  Administered 2014-05-31 – 2014-06-02 (×3): 1 via ORAL
  Filled 2014-05-31 (×3): qty 1

## 2014-05-31 MED ORDER — BENZOCAINE-MENTHOL 20-0.5 % EX AERO
1.0000 "application " | INHALATION_SPRAY | CUTANEOUS | Status: DC | PRN
  Administered 2014-05-31: 1 via TOPICAL
  Filled 2014-05-31 (×2): qty 56

## 2014-05-31 MED ORDER — OXYCODONE-ACETAMINOPHEN 5-325 MG PO TABS: 1.0000 | ORAL_TABLET | ORAL | Status: DC | PRN

## 2014-05-31 MED ORDER — ONDANSETRON HCL 4 MG PO TABS: 4.0000 mg | ORAL_TABLET | ORAL | Status: DC | PRN

## 2014-05-31 MED ORDER — OXYTOCIN 40 UNITS IN LACTATED RINGERS INFUSION - SIMPLE MED: 62.5000 mL/h | INTRAVENOUS | Status: DC

## 2014-05-31 MED ORDER — OXYTOCIN 10 UNIT/ML IJ SOLN
INTRAMUSCULAR | Status: AC
  Filled 2014-05-31: qty 1

## 2014-05-31 MED ORDER — DIBUCAINE 1 % RE OINT
1.0000 "application " | TOPICAL_OINTMENT | RECTAL | Status: DC | PRN
  Filled 2014-05-31: qty 28

## 2014-05-31 MED ORDER — MEASLES, MUMPS & RUBELLA VAC ~~LOC~~ INJ
0.5000 mL | INJECTION | Freq: Once | SUBCUTANEOUS | Status: AC
  Filled 2014-05-31: qty 0.5

## 2014-05-31 MED ORDER — OXYTOCIN 40 UNITS IN LACTATED RINGERS INFUSION - SIMPLE MED
INTRAVENOUS | Status: AC
  Filled 2014-05-31: qty 1000

## 2014-05-31 MED ORDER — ZOLPIDEM TARTRATE 5 MG PO TABS: 5.0000 mg | ORAL_TABLET | Freq: Every evening | ORAL | Status: DC | PRN

## 2014-05-31 MED ORDER — FLEET ENEMA 7-19 GM/118ML RE ENEM: 1.0000 | ENEMA | RECTAL | Status: DC | PRN

## 2014-05-31 MED ORDER — LIDOCAINE HCL (PF) 1 % IJ SOLN
30.0000 mL | INTRAMUSCULAR | Status: AC | PRN
  Administered 2014-05-31: 30 mL via SUBCUTANEOUS
  Filled 2014-05-31: qty 30

## 2014-05-31 MED ORDER — SIMETHICONE 80 MG PO CHEW: 80.0000 mg | CHEWABLE_TABLET | ORAL | Status: DC | PRN

## 2014-05-31 MED ORDER — SODIUM CHLORIDE 0.9 % IJ SOLN: 3.0000 mL | INTRAMUSCULAR | Status: DC | PRN

## 2014-05-31 MED ORDER — LANOLIN HYDROUS EX OINT: TOPICAL_OINTMENT | CUTANEOUS | Status: DC | PRN

## 2014-05-31 MED ORDER — ACETAMINOPHEN 325 MG PO TABS: 650.0000 mg | ORAL_TABLET | ORAL | Status: DC | PRN

## 2014-05-31 MED ORDER — LACTATED RINGERS IV SOLN: 500.0000 mL | INTRAVENOUS | Status: DC | PRN

## 2014-05-31 MED ORDER — OXYCODONE-ACETAMINOPHEN 5-325 MG PO TABS
2.0000 | ORAL_TABLET | ORAL | Status: DC | PRN
Start: 2014-05-31 — End: 2014-06-02

## 2014-05-31 MED ORDER — LACTATED RINGERS IV SOLN
INTRAVENOUS | Status: DC
  Administered 2014-05-31: 04:00:00 via INTRAVENOUS

## 2014-05-31 MED ORDER — TETANUS-DIPHTH-ACELL PERTUSSIS 5-2.5-18.5 LF-MCG/0.5 IM SUSP: 0.5000 mL | Freq: Once | INTRAMUSCULAR | Status: DC

## 2014-05-31 MED ORDER — CITRIC ACID-SODIUM CITRATE 334-500 MG/5ML PO SOLN: 30.0000 mL | ORAL | Status: DC | PRN

## 2014-05-31 MED ORDER — WITCH HAZEL-GLYCERIN EX PADS
1.0000 | MEDICATED_PAD | CUTANEOUS | Status: DC | PRN
Start: 2014-05-31 — End: 2014-06-02

## 2014-05-31 MED ORDER — ONDANSETRON HCL 4 MG/2ML IJ SOLN: 4.0000 mg | Freq: Four times a day (QID) | INTRAMUSCULAR | Status: DC | PRN

## 2014-05-31 MED ORDER — OXYCODONE-ACETAMINOPHEN 5-325 MG PO TABS: 2.0000 | ORAL_TABLET | ORAL | Status: DC | PRN

## 2014-05-31 MED ORDER — SENNOSIDES-DOCUSATE SODIUM 8.6-50 MG PO TABS
2.0000 | ORAL_TABLET | ORAL | Status: DC
  Administered 2014-06-01 – 2014-06-02 (×2): 2 via ORAL
  Filled 2014-05-31 (×2): qty 2

## 2014-05-31 MED ORDER — OXYTOCIN BOLUS FROM INFUSION
500.0000 mL | INTRAVENOUS | Status: DC
  Administered 2014-05-31: 500 mL via INTRAVENOUS

## 2014-05-31 MED ORDER — LIDOCAINE HCL (PF) 1 % IJ SOLN
INTRAMUSCULAR | Status: AC
  Filled 2014-05-31: qty 30

## 2014-05-31 MED ORDER — IBUPROFEN 600 MG PO TABS
600.0000 mg | ORAL_TABLET | Freq: Four times a day (QID) | ORAL | Status: DC
  Administered 2014-05-31 – 2014-06-02 (×10): 600 mg via ORAL
  Filled 2014-05-31 (×10): qty 1

## 2014-05-31 MED ORDER — DIPHENHYDRAMINE HCL 25 MG PO CAPS: 25.0000 mg | ORAL_CAPSULE | Freq: Four times a day (QID) | ORAL | Status: DC | PRN

## 2014-05-31 NOTE — Plan of Care (Signed)
Problem: Phase I Progression Outcomes Goal: Pain controlled with appropriate interventions Outcome: Completed/Met Date Met:  05/31/14 Goal: Voiding adequately Outcome: Completed/Met Date Met:  05/31/14 Goal: Foley catheter patent Outcome: Not Applicable Date Met:  05/31/61 Goal: OOB as tolerated unless otherwise ordered Outcome: Completed/Met Date Met:  05/31/14 Goal: IS, TCDB as ordered Outcome: Not Applicable Date Met:  44/69/50 Goal: VS, stable, temp < 100.4 degrees F Outcome: Completed/Met Date Met:  05/31/14 Goal: Initial discharge plan identified Outcome: Completed/Met Date Met:  05/31/14 Goal: Other Phase I Outcomes/Goals Outcome: Not Applicable Date Met:  72/25/75

## 2014-05-31 NOTE — H&P (Signed)
Subjective:  Angelica Dickerson is a 28 y.o. G1 P0 female with EDC 05/30/14 at 40 and 1/[redacted] weeks gestation who is being admitted for labor management, PROM.  Her current obstetrical history is uncomplicated.  Patient reports contractions since 0100 after her water broke..   Fetal Movement: normal.     Objective:   Vital signs in last 24 hours: Pulse Rate:  [73-77] 73 (11/21 0446) BP: (113-134)/(64-86) 113/70 mmHg (11/21 0446) Weight:  [146 lb (66.225 kg)] 146 lb (66.225 kg) (11/21 0446)   General:   alert, cooperative, appears stated age and mild distress  Skin:   normal  HEENT:  sclera clear, anicteric and neck supple with midline trachea  Lungs:   regular rate with no SOB  Heart:   regular rate and rhythm  Breasts:   normal without suspicious masses, skin or nipple changes or axillary nodes and self-exam is taught and encouraged  Abdomen:  gravid abdomen  Pelvis:  complete  FHT:  125 BPM  Uterine Size: size equals dates  Presentations: cephalic  Cervix:    Dilation: 10 cm   Effacement: 100%   Station:  +2   Consistency: soft   Position: anterior   Lab Review  O, Rh+, GBS positive  AFP:NML  One hour GTT: Normal    Assessment/Plan:  40 and 1/[redacted] weeks gestation. Active phase labor. Obstetrical history uncomplicated.     Risks, benefits, alternatives and possible complications have been discussed in detail with the patient.  Pre-admission, admission, and post admission procedures and expectations were discussed in detail.  All questions answered, all appropriate consents will be signed at the Hospital. Admission is planned for now.  Expectant management. and Anticipate vaginal delivery.   ADAMS,SHNIQUAL SHWON 05/31/2014 9:44 AM   I have seen and examined this patient and I agree with the above. Cam HaiSHAW, Danialle Dement CNM 10:08 PM 06/03/2014

## 2014-05-31 NOTE — MAU Note (Signed)
Report called to Dana RN in BS.  

## 2014-05-31 NOTE — Progress Notes (Signed)
Dr Pernell DupreAdams notified of pt's admission and status. Aware of 10cm and +1station, pos. GBS, allergic to Amox, and going to 164

## 2014-06-01 MED ORDER — MEASLES, MUMPS & RUBELLA VAC ~~LOC~~ INJ
0.5000 mL | INJECTION | Freq: Once | SUBCUTANEOUS | Status: AC
  Administered 2014-06-02: 0.5 mL via SUBCUTANEOUS
  Filled 2014-06-01 (×2): qty 0.5

## 2014-06-01 NOTE — Plan of Care (Signed)
Problem: Consults Goal: Postpartum Patient Education (See Patient Education module for education specifics.)  Outcome: Completed/Met Date Met:  06/01/14 Goal: Skin Care Protocol Initiated - if Braden Score 18 or less If consults are not indicated, leave blank or document N/A  Outcome: Not Applicable Date Met:  90/30/14 Goal: Nutrition Consult-if indicated Outcome: Not Applicable Date Met:  99/69/24 Goal: Diabetes Guidelines if Diabetic/Glucose > 140 If diabetic or lab glucose is > 140 mg/dl - Initiate Diabetes/Hyperglycemia Guidelines & Document Interventions  Outcome: Not Applicable Date Met:  93/24/19  Problem: Discharge Progression Outcomes Goal: Barriers To Progression Addressed/Resolved Outcome: Not Applicable Date Met:  91/44/45 Goal: Activity appropriate for discharge plan Outcome: Completed/Met Date Met:  06/01/14 Goal: Tolerating diet Outcome: Completed/Met Date Met:  84/83/50 Goal: Complications resolved/controlled Outcome: Completed/Met Date Met:  06/01/14 Goal: Pain controlled with appropriate interventions Outcome: Completed/Met Date Met:  06/01/14 Goal: Afebrile, VS remain stable at discharge Outcome: Completed/Met Date Met:  06/01/14 Goal: Remove staples per MD order Outcome: Not Applicable Date Met:  75/73/22

## 2014-06-01 NOTE — Lactation Note (Addendum)
This note was copied from the chart of Angelica Ronnette HilaElizabeth Dickerson. Lactation Consultation Note New mom has semi flat nipples, and small breast. Has good flow of colostrum. #16 NS given and encouraged to use. Mom trying to feed w/o NS. Discussed milk transfer and deep latch. Reverse pressure to areola noted slight improvement. Mom taught how to apply & clean nipple shield. Mom reports + breast changes w/pregnancy.  Educated about newborn behavior. Referred to Baby and Me Book in Breastfeeding section Pg. 22-23 for position options and Proper latch demonstration. Mom encouraged to do skin-to-skin. Mom encouraged to waken baby for feeds. Mom encouraged to feed baby 8-12 times/24 hours and with feeding cues. Hand expression taught to Mom. Mom reports increased comfort. WH/LC brochure given w/resources, support groups and LC services. Shells given and encouraged to wear in am. With bra. Patient Name: Angelica Ronnette Hilalizabeth Daniely ZOXWR'UToday's Date: 06/01/2014 Reason for consult: Initial assessment   Maternal Data Has patient been taught Hand Expression?: Yes Does the patient have breastfeeding experience prior to this delivery?: No  Feeding Feeding Type: Breast Fed Length of feed: 15 min  LATCH Score/Interventions Latch: Grasps breast easily, tongue down, lips flanged, rhythmical sucking.  Audible Swallowing: A few with stimulation Intervention(s): Skin to skin;Hand expression;Alternate breast massage  Type of Nipple: Flat Intervention(s): Reverse pressure;Shells;Hand pump  Comfort (Breast/Nipple): Soft / non-tender     Hold (Positioning): Assistance needed to correctly position infant at breast and maintain latch. Intervention(s): Breastfeeding basics reviewed;Support Pillows;Position options;Skin to skin  LATCH Score: 7  Lactation Tools Discussed/Used Tools: Shells;Nipple Dorris CarnesShields;Pump Nipple shield size: 16 Shell Type: Inverted Breast pump type: Manual Pump Review: Setup, frequency, and  cleaning;Milk Storage Initiated by:: Peri JeffersonL. Kirk Sampley RN Date initiated:: 05/31/14   Consult Status Consult Status: Follow-up Date: 06/01/14 Follow-up type: In-patient    Fumiko Cham, Diamond NickelLAURA G 06/01/2014, 1:30 AM

## 2014-06-01 NOTE — Plan of Care (Signed)
Problem: Phase II Progression Outcomes Goal: Pain controlled on oral analgesia Outcome: Completed/Met Date Met:  06/01/14 Goal: Progress activity as tolerated unless otherwise ordered Outcome: Completed/Met Date Met:  06/01/14 Goal: Afebrile, VS remain stable Outcome: Completed/Met Date Met:  06/01/14 Goal: Incision intact & without signs/symptoms of infection Outcome: Not Applicable Date Met:  64/08/90 Goal: Rh isoimmunization per orders Outcome: Not Applicable Date Met:  97/52/95 Goal: Tolerating diet Outcome: Completed/Met Date Met:  06/01/14 Goal: Other Phase II Outcomes/Goals Outcome: Not Applicable Date Met:  53/97/14

## 2014-06-02 ENCOUNTER — Ambulatory Visit (HOSPITAL_COMMUNITY): Payer: 59

## 2014-06-02 MED ORDER — IBUPROFEN 600 MG PO TABS: 600.0000 mg | ORAL_TABLET | Freq: Four times a day (QID) | ORAL | Status: DC

## 2014-06-02 NOTE — Lactation Note (Signed)
This note was copied from the chart of Angelica Ronnette HilaElizabeth Alban. Lactation Consultation Note; Mother complains of nipple tenderness. Observed mother pumping using a #24 flange. Reviewed application of the nipple shield. Observed that the #16 nipple shield is tight . Attempt to latch infant on the (R) breast without the nipple shield. Mothers nipples are semi flat and are dimpled. When compressed that are flat. Infant unable to grasp entire nipple. Observed pinching of the tip of the nipple. Mother was fit with a #20 nipple shield. Observed that infant is able to get good depth. Infant was given 3-4 ml of ebm with a curved tip syringe while at the breast through the nipple shield. FOB taught to give infant ebm through the nipple shield. Additional  5 ml of ebm given.  Infant has had 2 stools but has not had a stool in 36 hours. Infant has had 10 wet diapers. Mother has good flow of colostrum. Infant sustained latch for 15-20 mins with observed colostrum in the nipple shield. Advised mother to continue pre pump and offer bare breast , if unable to get infant latched use the #20 nipple shield. Suggested  to post pump and offer infant ebm after feedings until milk in and infant has had another stool,  Discussed cluster feeding and informed mother to fed infant 8-12 times in 24 hours. Mother has a Peds appointment on Wednesday for follow up weight check.  Mother has an electric pump at home. Mother is aware of available LC services.  [facility].  Patient Name: Angelica Dickerson AOZHY'QToday's Date: 06/02/2014 Reason for consult: Follow-up assessment   Maternal Data    Feeding Feeding Type: Breast Fed Length of feed: 15 min  LATCH Score/Interventions Latch: Grasps breast easily, tongue down, lips flanged, rhythmical sucking. Intervention(s): Skin to skin Intervention(s): Adjust position;Assist with latch  Audible Swallowing: Spontaneous and  intermittent  Type of Nipple: Flat Intervention(s): Shells;Double electric pump  Comfort (Breast/Nipple): Filling, red/small blisters or bruises, mild/mod discomfort  Problem noted: Filling Interventions (Mild/moderate discomfort): Hand expression;Comfort gels  Hold (Positioning): Assistance needed to correctly position infant at breast and maintain latch. Intervention(s): Support Pillows;Position options;Skin to skin  LATCH Score: 7  Lactation Tools Discussed/Used Tools: Nipple Shields Nipple shield size: 20   Consult Status      Stevan BornKendrick, Raunak Antuna McCoy 06/02/2014, 2:12 PM

## 2014-06-02 NOTE — Discharge Instructions (Signed)

## 2014-06-02 NOTE — Lactation Note (Signed)
This note was copied from the chart of Angelica Ronnette HilaElizabeth Sedor. Lactation Consultation Note Mom is post pumping and getting out 20 ml colostrum gave to baby in syring. Mom using NS, states working well, seeing colostrum in NS. [facility]our[facility]c home. Mom hasn't worn her shells yet. Encouraged to wear in am. C/o slight soreness. Comfort gels given. Discussed encouragement, pumping, storing milk, and feeding. Mom will be staying home w/baby. Mom massaging breast at intervals during BF. Answered questions mom had. Encouraged to call for further questions. Patient Name: Angelica Dickerson ZOXWR'UToday's Date: 06/02/2014 Reason for consult: Follow-up assessment;Infant weight loss   Maternal Data    Feeding Feeding Type: Breast Milk Length of feed: 40 min  LATCH Score/Interventions Latch: Grasps breast easily, tongue down, lips flanged, rhythmical sucking. Intervention(s): Skin to skin;Teach feeding cues;Waking techniques Intervention(s): Breast massage;Breast compression  Audible Swallowing: Spontaneous and intermittent Intervention(s): Hand expression Intervention(s): Alternate breast massage  Type of Nipple: Flat Intervention(s): Shells;Double electric pump  Comfort (Breast/Nipple): Filling, red/small blisters or bruises, mild/mod discomfort  Problem noted: Mild/Moderate discomfort Interventions (Mild/moderate discomfort): Comfort gels;Breast shields;Post-pump;Hand massage;Hand expression  Hold (Positioning): No assistance needed to correctly position infant at breast.  LATCH Score: 8  Lactation Tools Discussed/Used Tools: Nipple Shields;Pump;Comfort gels Nipple shield size: 16 Shell Type: Inverted Breast pump type: Double-Electric Breast Pump Pump Review: Setup, frequency, and cleaning;Milk Storage Initiated by:: RN Date initiated:: 06/01/14   Consult Status Consult Status: Complete Date: 06/02/14 Follow-up type: Call as needed    Tarah Buboltz,  Natoya Viscomi G 06/02/2014, 4:00 AM

## 2014-06-02 NOTE — Progress Notes (Signed)
Post Partum Day 1 Subjective: no complaints, up ad lib and voiding  Objective: Blood pressure 110/59, pulse 83, temperature 98 F (36.7 C), temperature source Oral, resp. rate 18, height 5\' 6"  (1.676 m), weight 66.225 kg (146 lb), last menstrual period 08/23/2013, SpO2 99 %, unknown if currently breastfeeding.  Physical Exam:  Filed Vitals:   06/02/14 0625  BP: 110/59  Pulse: 83  Temp: 98 F (36.7 C)  Resp: 18  General: alert, cooperative and appears stated age Lochia: appropriate Uterine Fundus: firm Incision: n/a DVT Evaluation: No evidence of DVT seen on physical exam. Negative Homan's sign.    Recent Labs  05/31/14 0350  HGB 14.0  HCT 40.5    Assessment/Plan: Plan for discharge tomorrow   LOS: 2 days   Marlis EdelsonKARIM, Avory Mimbs N 06/02/2014, 7:32 AM

## 2014-06-02 NOTE — Discharge Summary (Signed)
Obstetric Discharge Summary Reason for Admission: onset of labor and rupture of membranes Prenatal Procedures: ultrasound Intrapartum Procedures: spontaneous vaginal delivery Postpartum Procedures: none Complications-Operative and Postpartum: 2nd degree perineal laceration HEMOGLOBIN  Date Value Ref Range Status  05/31/2014 14.0 12.0 - 15.0 g/dL Final   HCT  Date Value Ref Range Status  05/31/2014 40.5 36.0 - 46.0 % Final    Physical Exam:  Filed Vitals:   06/02/14 0625  BP: 110/59  Pulse: 83  Temp: 98 F (36.7 C)  Resp: 18    General: alert, cooperative and appears stated age Lochia: appropriate Uterine Fundus: firm Incision: Dickerson/a DVT Evaluation: No evidence of DVT seen on physical exam. Negative Homan's sign.  Discharge Diagnoses: Term Pregnancy-delivered  Discharge Information: Date: 06/02/2014 Activity: pelvic rest Diet: routine Medications: Ibuprofen Condition: stable Instructions: refer to practice specific booklet Discharge to: home Follow-up Information    [facility] [place]. Schedule an appointment as soon as possible for a visit in 6 weeks.   Specialty:  Obstetrics and Gynecology   Contact information:   [address], [address] [place] [id number] [phone]      Newborn Data: Live born female  Birth Weight: 7 lb 3.3 oz (3270 g) APGAR: 8, 9  Home with mother.  Angelica Dickerson, Angelica Dickerson 06/02/2014, 7:30 AM

## 2014-06-04 ENCOUNTER — Encounter: Payer: 59 | Admitting: Obstetrics & Gynecology

## 2014-06-06 ENCOUNTER — Ambulatory Visit (HOSPITAL_COMMUNITY): Payer: 59

## 2014-06-09 ENCOUNTER — Ambulatory Visit (HOSPITAL_COMMUNITY)
Admit: 2014-06-09 | Discharge: 2014-06-09 | Disposition: A | Discharge status: Home / Self Care | Payer: 59 | Attending: Family | Admitting: Family

## 2014-06-09 NOTE — Lactation Note (Signed)
Lactation Consult  Mother's reason for visit: Follow up form the hospital  Visit Type: Feeding assessment  Appointment Notes: using a nipple shield  Consult:  Initial Lactation Consultant:  Kathrin GreathouseIorio, Cherise Fedder Ann  ________________________________________________________________________  Joan FloresBaby's Name: Angelica BenderMiles Dickerson Date of Birth: 05/31/2014 Pediatrician: Dr. Vonna KotykeClaire  Gender: female Gestational Age: 6325w1d (At Birth) Birth Weight: 7 lb 3.3 oz (3270 g) Weight at Discharge: Weight: 6 lb 10 oz (3005 g)Date of Discharge: 06/02/2014 Filed Weights   05/31/14 0406 06/01/14 0015 06/02/14 0051  Weight: 7 lb 3.3 oz (3270 g) 6 lb 14.1 oz (3120 g) 6 lb 10 oz (3005 g)   [facility]:  Location:Pediatrician's office Weight today: 7-4.7 oz    ________________________________________________________________________  Mother's Name: Angelica HilaElizabeth Dickerson Type of delivery:  Vaginal  Breastfeeding Experience:  Per mom baby eats well , still some pain with latching on the right side , possibly due to positioning  Maternal Medical Conditions:  No risk  Maternal Medications:  PNV   ________________________________________________________________________  Breastfeeding History (Post Discharge)  Frequency of breastfeeding: every 2 1/2 - 3 hours  Duration of feeding:  30 - 60 mins   Supplementing - No  Pumping - per mom with a DEBP Medela once a day with 1.5 - 2 oz EBM yield    Infant Intake and Output Assessment  Voids:  8-10 , in 24 hClear yellow Stools:  6-8 yellow  Color:  Yellow in 24 hrs.   ________________________________________________________________________  Maternal Breast Assessment  Breast:  Full Nipple:  Erect Pain level:  0 Pain interventions:  Expressed breast milk  _______________________________________________________________________ Feeding Assessment/Evaluation  Initial feeding  assessment:  Infant's oral assessment:  Variance- LC noted a short labial frenulum above gum line , able to stretch upper lip to flanged position                                                              Able to elevate tongue to corners of mouth   Positioning:  Cross cradle Left breast  LATCH documentation:  Latch:  2 = Grasps breast easily, tongue down, lips flanged, rhythmical sucking.  Audible swallowing:  2 = Spontaneous and intermittent  Type of nipple:  2 = Everted at rest and after stimulation ( some areola edema noted )   Comfort (Breast/Nipple):  1 = Filling, red/small blisters or bruises, mild/mod discomfort( no engorgement )   Hold (Positioning):  1 = Assistance needed to correctly position infant at breast and maintain latch  LATCH score: 8   Attached assessment:  Deep  Lips flanged:  No at 1st , LC flipped upper lip   Lips untucked:  Yes.    Suck assessment:  Nutritive  Tools:  None at this latch  Instructed on use and cleaning of tool:  No.  Pre-feed weight:  3308 g , 7-4.7 oz  Post-feed weight: 3314 g , 7-4.9 oz  Amount transferred: 6 ml  Amount supplemented:  None   Additional Feeding Assessment -   Infant's oral assessment:  Variance ( see above note )   Positioning:  Cross cradle Right breast  LATCH documentation:  Latch:  2 = Grasps breast easily, tongue down, lips flanged, rhythmical sucking.  Audible swallowing:  2 = Spontaneous and intermittent  Type of nipple:  2 = Everted at rest and after stimulation  Comfort (Breast/Nipple):  1 = Filling, red/small blisters or bruises, mild/mod discomfort  Hold (Positioning):  1 ( LC flipped upper lip to flanged position   LATCH score:  8 -  Attached assessment:  Deep  Lips flanged:  No.  Lips untucked:  Yes.    Suck assessment:  Nutritive  Tools:  None  Instructed on use and cleaning of tool:  None   Pre-feed weight:  3314 g , 7-4.9 oz  Post-feed weight:  3320 g , 7-5.1 oz  Amount transferred: 6  ml  Amount supplemented: none   Added latch , switched to football position on the right breast with a #24 Nipple shield ,  More swallows noted, increased with breast compressions ,  Wet changed prior to latch  Re- weight  Pre-weight - 3298 g , 7-4.4 oz  Post weight - 3324 g , 7-5.3 oz  Amount transferred = 26 ml   Total amount pumped post feed:  Mom did not pump at consult  Total amount transferred: 38  ml Total supplement given:  None  Lactation Impression:  Per mom  Had last fed at 1230 pm both breast for 15 mins each and then post pumped for 10 mins with a 2 1/2 oz EBM yield, ( at 1300-1315 )  [facility]  [facility] and 1/2 later, LC felt the milk transferred at this consult was lower than expected due to a short time between feeding and pumping. Discussed this with mom and dad. Also baby transferred more milk off with a larger nipple shield compared to latching without a nipple shield   Lactation Plan of care: Weight check with [facility] this Thursday or Friday , and F/U weight check next Monday Evening or Tuesday [facility]  Praised mom for her efforts breast feeding  Feedings - with feeding cues , every 2 1/2 - 3 hours , Cluster feeding especially with growth spurts   Steps for latching - LC recommended using a #24 Nipple shield , until the baby continues to grow , especially noted a high palate , ( gives the baby's palate more stimulation )  Average feeding >10 mins , 15 -20 mins , less, equal , ore greater on the 2nd breast  Extra pumping - due to using the Nipple shield - after at least 3 feedings a day for 10 mins

## 2014-07-14 ENCOUNTER — Other Ambulatory Visit: Payer: Self-pay | Admitting: Advanced Practice Midwife

## 2014-07-14 ENCOUNTER — Encounter: Payer: Self-pay | Admitting: Advanced Practice Midwife

## 2014-07-14 ENCOUNTER — Ambulatory Visit (INDEPENDENT_AMBULATORY_CARE_PROVIDER_SITE_OTHER): Payer: 59 | Admitting: Advanced Practice Midwife

## 2014-07-14 VITALS — BP 123/73 | HR 75 | Resp 16 | Ht 65.0 in | Wt 127.0 lb

## 2014-07-14 DIAGNOSIS — Z309 Encounter for contraceptive management, unspecified: Secondary | ICD-10-CM

## 2014-07-14 DIAGNOSIS — Z30011 Encounter for initial prescription of contraceptive pills: Secondary | ICD-10-CM

## 2014-07-14 DIAGNOSIS — L918 Other hypertrophic disorders of the skin: Secondary | ICD-10-CM

## 2014-07-14 MED ORDER — NORETHINDRONE 0.35 MG PO TABS: 1.0000 | ORAL_TABLET | Freq: Every day | ORAL | Status: DC

## 2014-07-14 NOTE — Progress Notes (Signed)
   Subjective:    Patient ID: Angelica Dickerson, female    DOB: 06/25/1986, 29 y.o.   MRN: [id number]  HPI: Pt is 6 weeks postpartum SVD here for PP visit. PP course has been uncomplicated. Baby is breastfeeding well. Pt reports normal bladder and bowel habits. Lochia ceased. Pt has not resumed IC. Plans to use POPs.   Pt has noticed a bump on her left labia majora that gets irritated from rubbing on clothing, No bleeding or drainage. Thanks she may have a skin tag there, but it has gotten bigger. No tingling, burning. Pt requets removal if possible.    Review of Systems Neg for fever, chills, abd pain, depressed mood..     Objective:   Physical Exam GENERAL: NAD. A&Ox4. Upbeat mood. HEART: RRR, no M/R/G LUNGS: CTAB BREASTS: Declined exam ABD: Soft NT. Fundus non-palpable PELVIC: NEFG except for 3 cm raised flesh-colored smooth mass C/W skin tag. Perineum healing well. Sutures almost completely disolved. Remnants wiped off.   Procedure: Left labia numbed w/ Hurricane spray. Base of skin tag ligated. Skin tag removed w/ #11 scalpel. No bleeding. Silver Nitrate applied. Suture removed. Pt tolerated well.     Assessment & Plan:   1. Skin tag   2. OCP (oral contraceptive pills) initiation   3.      PP Care   F/U PRN (Pt moving to [place] soon and will get routine Gyn care there.)   Medication List       This list is accurate as of: 07/14/14 11:59 PM.  Always use your most recent med list.               ibuprofen 600 MG tablet  Commonly known as:  ADVIL,MOTRIN  Take 1 tablet (600 mg total) by mouth every 6 (six) hours.     norethindrone 0.35 MG tablet  Commonly known as:  MICRONOR,CAMILA,ERRIN  Take 1 tablet (0.35 mg total) by mouth daily.     prenatal multivitamin Tabs tablet  Take 1 tablet by mouth daily at 12 noon.

## 2014-07-16 NOTE — Patient Instructions (Signed)
Breastfeeding Challenges and Solutions Even though breastfeeding is natural, it can be challenging, especially in the first few weeks after childbirth. It is normal for problems to arise when starting to breastfeed your new baby, even if you have breastfed before. This document provides some solutions to the most common breastfeeding challenges.  CHALLENGES AND SOLUTIONS Challenge--Cracked or Sore Nipples Cracked or sore nipples are commonly experienced by breastfeeding mothers. Cracked or sore nipples often are caused by inadequate latching (when your baby's mouth attaches to your breast to breastfeed). Soreness can also happen if your baby is not positioned properly at your breast. Although nipple cracking and soreness are common during the first week after birth, nipple pain is never normal. If you experience nipple cracking or soreness that lasts longer than 1 week or nipple pain, call your health care provider or lactation consultant.  Solution Ensure proper latching and positioning of your baby by following the steps below:  Find a comfortable place to sit or lie down, with your neck and back well supported.  Place a pillow or rolled up blanket under your baby to bring him or her to the level of your breast (if you are seated).  Make sure that your baby's abdomen is facing your abdomen.  Gently massage your breast. With your fingertips, massage from your chest wall toward your nipple in a circular motion. This encourages milk flow. You may need to continue this action during the feeding if your milk flows slowly.  Support your breast with 4 fingers underneath and your thumb above your nipple. Make sure your fingers are well away from your nipple and your baby's mouth.  Stroke your baby's lips gently with your finger or nipple.  When your baby's mouth is open wide enough, quickly bring your baby to your breast, placing your entire nipple and as much of the colored area around your nipple  (areola) as possible into your baby's mouth.  More areola should be visible above your baby's upper lip than below the lower lip.  Your baby's tongue should be between his or her lower gum and your breast.  Ensure that your baby's mouth is correctly positioned around your nipple (latched). Your baby's lips should create a seal on your breast and be turned out (everted).  It is common for your baby to suck for about 2-3 minutes in order to start the flow of breast milk. Signs that your baby has successfully latched on to your nipple include:   Quietly tugging or quietly sucking without causing you pain.   Swallowing heard between every 3-4 sucks.   Muscle movement above and in front of his or her ears with sucking.  Signs that your baby has not successfully latched on to nipple include:   Sucking sounds or smacking sounds from your baby while nursing.   Nipple pain.  Ensure that your breasts stay moisturized and healthy by:  Avoiding the use of soap on your nipples.   Wearing a supportive bra. Avoid wearing underwire-style bras or tight bras.   Air drying your nipples for 3-4 minutes after each feeding.   Using only cotton bra pads to absorb breast milk leakage. Leaking of breast milk between feedings is normal. Be sure to change the pads if they become soaked with milk.  Using lanolin on your nipples after nursing. Lanolin helps to maintain your skin's normal moisture barrier. If you use pure lanolin you do not need to wash it off before feeding your baby again. Pure lanolin   is not toxic to your baby. You may also hand express a few drops of breast milk and gently massage that milk into your nipples, allowing it to air dry. Challenge--Breast Engorgement Breast engorgement is the overfilling of your breasts with breast milk. In the first few weeks after giving birth, you may experience breast engorgement. Breast engorgement can make your breasts throb and feel hard, tightly  stretched, warm, and tender. Engorgement peaks about the fifth day after you give birth. Having breast engorgement does not mean you have to stop breastfeeding your baby. Solution  Breastfeed when you feel the need to reduce the fullness of your breasts or when your baby shows signs of hunger. This is called "breastfeeding on demand."  Newborns (babies younger than 4 weeks) often breastfeed every 1-3 hours during the day. You may need to awaken your baby to feed if he or she is asleep at a feeding time.  Do not allow your baby to sleep longer than 5 hours during the night without a feeding.  Pump or hand express breast milk before breastfeeding to soften your breast, areola, and nipple.  Apply warm, moist heat (in the shower or with warm water-soaked hand towels) just before feeding or pumping, or massage your breast before or during breastfeeding. This increases circulation and helps your milk to flow.  Completely empty your breasts when breastfeeding or pumping. Afterward, wear a snug bra (nursing or regular) or tank top for 1-2 days to signal your body to slightly decrease milk production. Only wear snug bras or tank tops to treat engorgement. Tight bras typically should be avoided by breastfeeding mothers. Once engorgement is relieved, return to wearing regular, loose-fitting clothes.  Apply ice packs to your breasts to lessen the pain from engorgement and relieve swelling, unless the ice is uncomfortable for you.  Do not delay feedings. Try to relax when it is time to feed your baby. This helps to trigger your "let-down reflex," which releases milk from your breast.  Ensure your baby is latched on to your breast and positioned properly while breastfeeding.  Allow your baby to remain at your breast as long as he or she is latched on well and actively sucking. Your baby will let you know when he or she is done breastfeeding by pulling away from your breast or falling asleep.  Avoid  introducing bottles or pacifiers to your baby in the early weeks of breastfeeding. Wait to introduce these things until after resolving any breastfeeding challenges.  Try to pump your milk on the same schedule as when your baby would breastfeed if you are returning to work or away from home for an extended period.  Drink plenty of fluids to avoid dehydration, which can eventually put you at greater risk of breast engorgement. If you follow these suggestions, your engorgement should improve in 24-48 hours. If you are still experiencing difficulty, call your lactation consultant or health care provider.  Challenge--Plugged Milk Ducts Plugged milk ducts occur when the duct does not drain milk effectively and becomes swollen. Wearing a tight-fitting nursing bra or having difficulty with latching may cause plugged milk ducts. Not drinking enough water (8-10 c [1.9-2.4 L] per day) can contribute to plugged milk ducts. Once a duct has become plugged, hard lumps, soreness, and redness may develop in your breast.  Solution Do not delay feedings. Feed your baby frequently and try to empty your breasts of milk at each feeding. Try breastfeeding from the affected side first so there is a   better chance that the milk will drain completely from that breast. Apply warm, moist towels to your breasts for 5-10 minutes before feeding. Alternatively, a hot shower right before breastfeeding can provide the moist heat that can encourage milk flow. Gentle massage of the sore area before and during a feeding may also help. Avoid wearing tight clothing or bras that put pressure on your breasts. Wear bras that offer good support to your breasts, but avoid underwire bras. If you have a plugged milk duct and develop a fever, you need to see your health care provider.  Challenge--Mastitis Mastitis is inflammation of your breast. It usually is caused by a bacterial infection and can cause flu-like symptoms. You may develop redness in  your breast and a fever. Often when mastitis occurs, your breast becomes firm, warm, and very painful. The most common causes of mastitis are poor latching, ineffective sucking from your baby, consistent pressure on your breast (possibly from wearing a tight-fitting bra or shirt that restricts the milk flow), unusual stress or fatigue, or missed feedings.  Solution You will be given antibiotic medicine to treat the infection. It is still important to breastfeed frequently to empty your breasts. Continuing to breastfeed while you recover from mastitis will not harm your baby. Make sure your baby is positioned properly during every feeding. Apply moist heat to your breasts for a few minutes before feeding to help the milk flow and to help your breasts empty more easily. Challenge--Thrush Thrush is a yeast infection that can form on your nipples, in your breast, or in your baby's mouth. It causes itching, soreness, burning or stabbing pain, and sometimes a rash.  Solution You will be given a medicated ointment for your nipples, and your baby will be given a liquid medicine for his or her mouth. It is important that you and your baby are treated at the same time because thrush can be passed between you and your baby. Change disposable nursing pads often. Any bras, towels, or clothing that come in contact with infected areas of your body or your baby's body need to be washed in very hot water every day. Wash your hands and your baby's hands often. All pacifiers, bottle nipples, or toys your baby puts in his or her mouth should be boiled once a day for 20 minutes. After 1 week of treatment, discard pacifiers and bottle nipples and buy new ones. All breast pump parts that touch the milk need to be boiled for 20 minutes every day. Challenge--Low Milk Supply You may not be producing enough milk if your baby is not gaining the proper amount of weight. Breast milk production is based on a supply-and-demand system. Your  milk supply depends on how frequently and effectively your baby empties your breast. Solution The more you breastfeed and pump, the more breast milk you will produce. It is important that your baby empties at least one of your breasts at each feeding. If this is not happening, then use a breast pump or hand express any milk that remains. This will help to drain as much milk as possible at each feeding. It will also signal your body to produce more milk. If your baby is not emptying your breasts, it may be due to latching, sucking, or positioning problems. If low milk supply continues after addressing these issues, contact your health care provider or a lactation specialist as soon as possible. Challenge--Inverted or Flat Nipples Some women have nipples that turn inward instead of protruding outward.   Other women have nipples that are flat. Inverted or flat nipples can sometimes make it more difficult for your baby to latch onto your breast. Solution You may be given a small device that pulls out inverted nipples. This device should be applied right before your baby is brought to your breast. You can also try using a breast pump for a short time before placing the baby at your breast. The pump can pull your nipple outwards to help your infant latch more easily. The baby's sucking motion will help the inverted nipple protrude as well.  If you have flat nipples, encourage your baby to latch onto your breast and feed frequently in the early days after birth. This will give your baby practice latching on correctly while your breast is still soft. When your milk supply increases, between the second and fifth day after birth and your breasts become full, your baby will have an easier time latching.  Contact a lactation consultant if you still have concerns. She or he can teach you additional techniques to address breastfeeding problems related to nipple shape and position.  FOR MORE INFORMATION [employer]  [employer]: www.llli.org Document Released: 12/19/2005 Document Revised: 07/02/2013 Document Reviewed: 12/21/2012 [employer] Patient Information 2015 [employer]. This information is not intended to replace advice given to you by your health care provider. Make sure you discuss any questions you have with your health care provider.  

## 2014-07-17 ENCOUNTER — Telehealth: Payer: Self-pay | Admitting: *Deleted

## 2014-07-17 NOTE — Telephone Encounter (Signed)
LM on voicemail of benign skin tag.

## 2014-07-17 NOTE — Telephone Encounter (Signed)
-----   Message from [place], [place] sent at 07/16/2014 10:49 PM EST ----- Please inform pt that she did indeed have a skin tag.

## 2015-06-07 IMAGING — US US OB COMP +14 WK
1 series · 12 of 28 positions shown · non-contrast
Comparison: none

[Series 1: us ob comp +14 wk · 111 acquisitions, 12 frames shown]
[im 5/111]
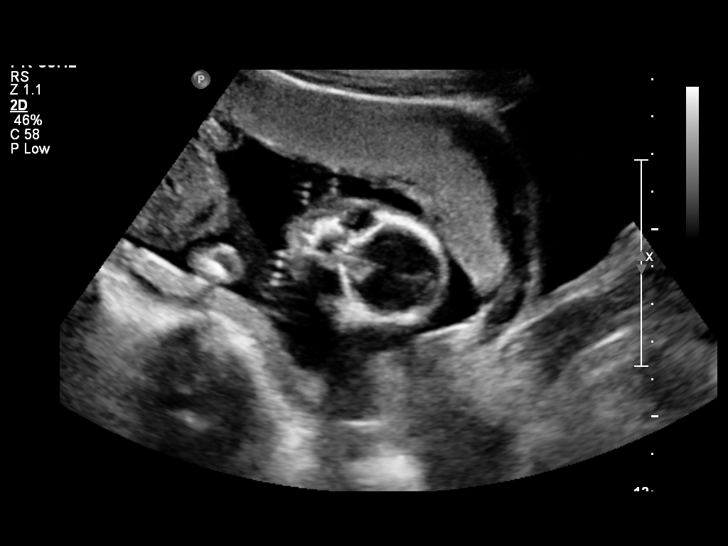
[im 13/111]
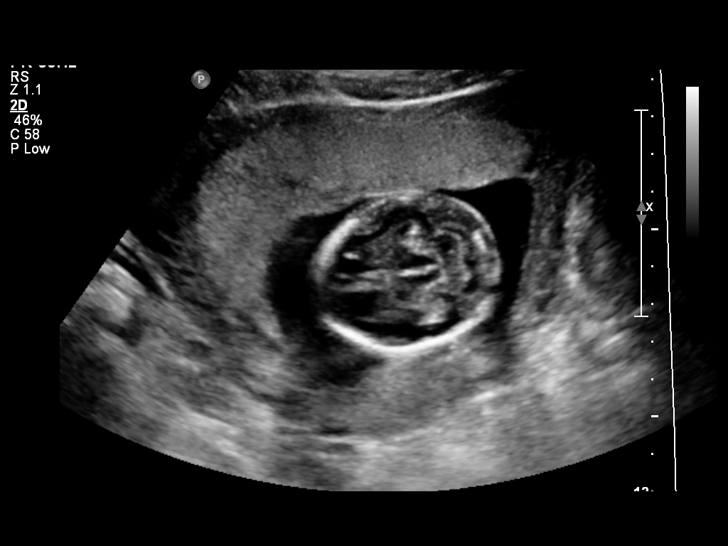
[im 21/111]
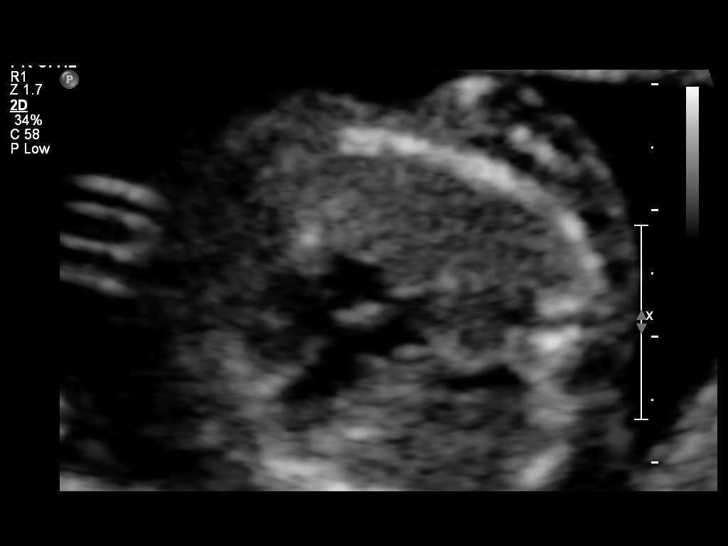
[im 33/111]
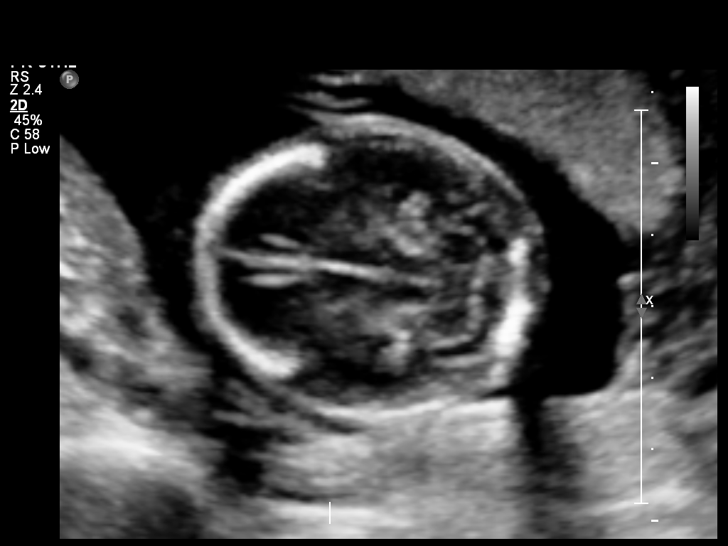
[im 41/111]
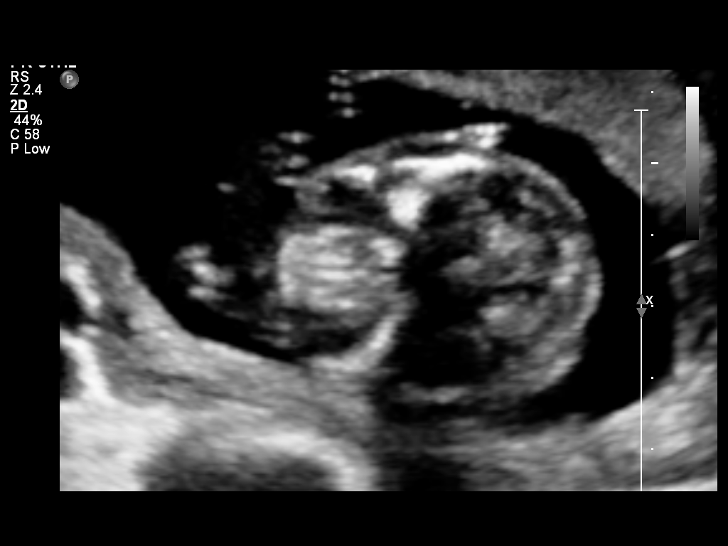
[im 49/111]
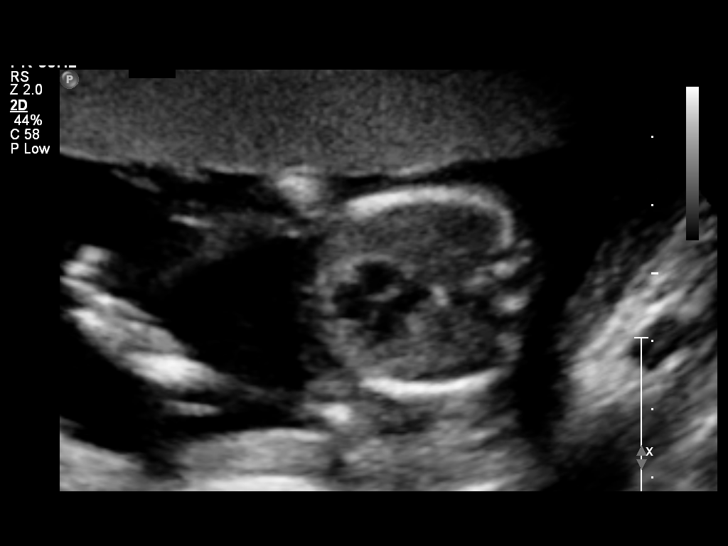
[im 62/111]
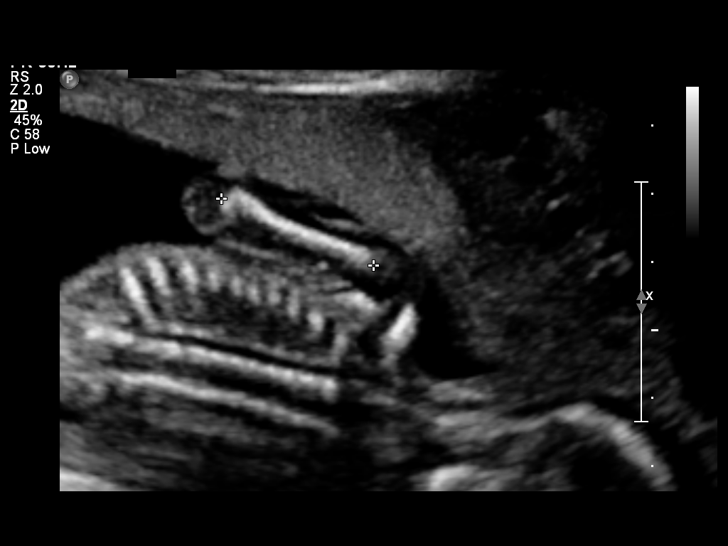
[im 70/111]
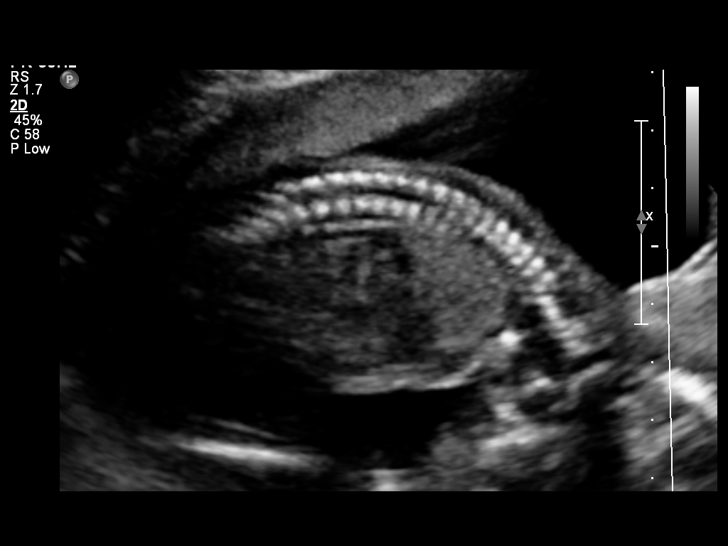
[im 78/111]
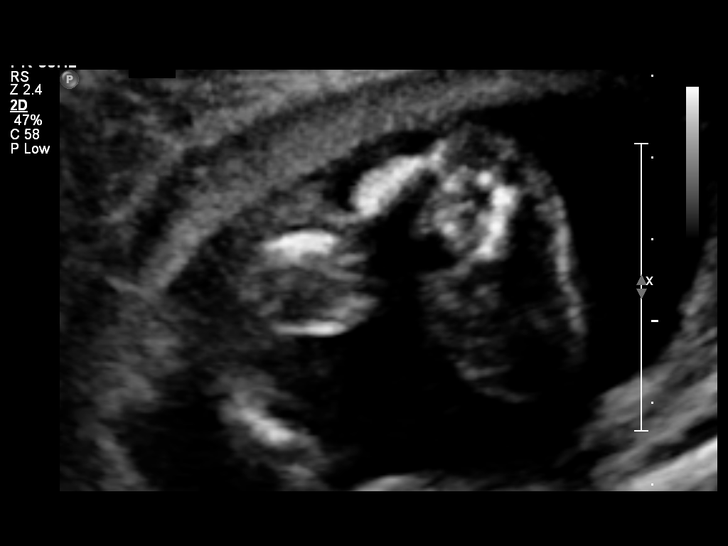
[im 90/111]
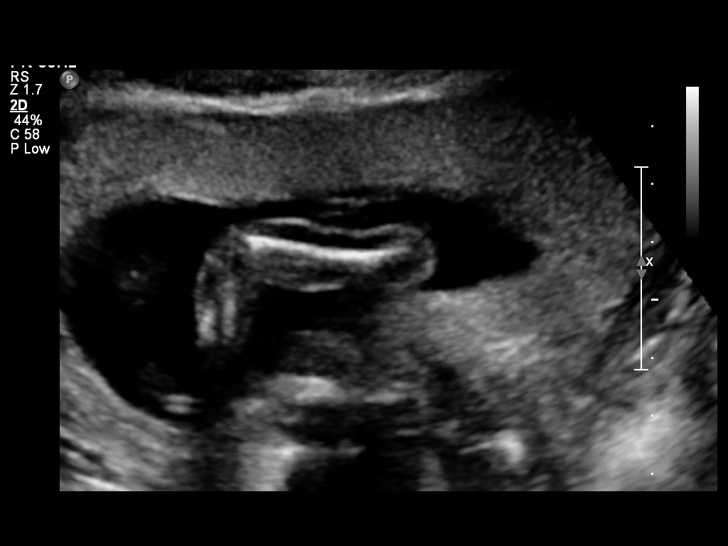
[im 98/111]
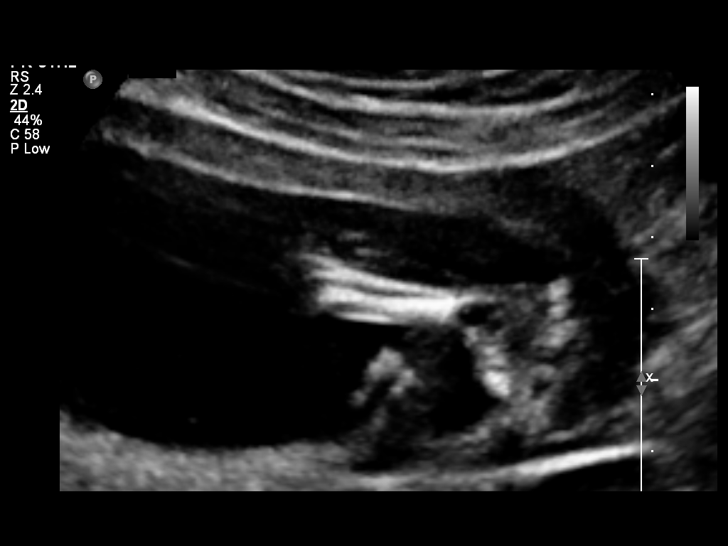
[im 106/111]
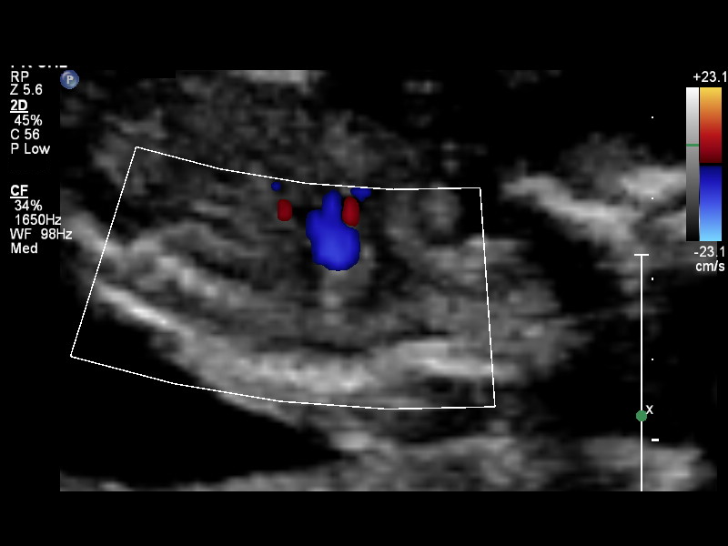

[12 of 28 positions shown; findings below may reference images not displayed]

OBSTETRICS REPORT
                      (Signed Final 12/27/2013 [DATE])

Service(s) Provided

 US OB COMP + 14 WK                                    76805.1
Indications

 Basic anatomic survey
Fetal Evaluation

 Num Of Fetuses:    1
 Fetal Heart Rate:  155                          bpm
 Cardiac Activity:  Observed
 Presentation:      Cephalic
 Placenta:          Anterior, above cervical os
 P. Cord            Visualized, central
 Insertion:

 Amniotic Fluid
 AFI FV:      Subjectively within normal limits
                                             Larg Pckt:     4.5  cm
Biometry

 BPD:     39.6  mm     G. Age:  18w 0d                CI:        75.79   70 - 86
                                                      FL/HC:      17.1   15.8 -
                                                                         18
 HC:     144.2  mm     G. Age:  17w 4d       25  %    HC/AC:      1.16   1.07 -

 AC:     124.8  mm     G. Age:  18w 1d       51  %    FL/BPD:
 FL:      24.7  mm     G. Age:  17w 3d       25  %    FL/AC:      19.8   20 - 24
 HUM:     23.9  mm     G. Age:  17w 3d       38  %
 CER:       17  mm     G. Age:  17w 0d       28  %
 NFT:     3.48  mm

 Est. FW:     210  gm      0 lb 7 oz     45  %
Gestational Age

 LMP:           18w 0d        Date:  08/23/13                 EDD:   05/30/14
 U/S Today:     17w 5d                                        EDD:   06/01/14
 Best:          18w 0d     Det. By:  LMP  (08/23/13)          EDD:   05/30/14
Anatomy

 Cranium:          Appears normal         Aortic Arch:      Not well visualized
 Fetal Cavum:      Appears normal         Ductal Arch:      Not well visualized
 Ventricles:       Appears normal         Diaphragm:        Appears normal
 Choroid Plexus:   Appears normal         Stomach:          Appears normal, left
                                                            sided
 Cerebellum:       Appears normal         Abdomen:          Appears normal
 Posterior Fossa:  Appears normal         Abdominal Wall:   Appears nml (cord
                                                            insert, abd wall)
 Nuchal Fold:      Appears normal         Cord Vessels:     Appears normal (3
                                                            vessel cord)
 Face:             Appears normal         Kidneys:          Appear normal
                   (orbits and profile)
 Lips:             Appears normal         Bladder:          Appears normal
 Heart:            Appears normal         Spine:            Appears normal
                   (4CH, axis, and
                   situs)
 RVOT:             Appears normal         Lower             Appears normal
                                          Extremities:
 LVOT:             Appears normal         Upper             Appears normal
                                          Extremities:

 Other:  Fetus appears to be a male, Parents do not wish to know sex of fetus.
         Nasal bone visualized. 5th digits visualized. Technically difficult due
         to fetal position & early gestation age.
Targeted Anatomy

 Fetal Central Nervous System
 Lat. Ventricles:  6.8                    Cisterna Magna:
Cervix Uterus Adnexa

 Cervical Length:    3.89     cm

 Cervix:       Normal appearance by transabdominal scan.
 Uterus:       No abnormality visualized.
 Left Ovary:    Not visualized.
 Right Ovary:   Not visualized.
 Adnexa:     No abnormality visualized.
Impression

 Single IUP at 18w 0d
 Normal fetal anatomic survey
 The 4CH and outflow tracts appear normal.  The aortic and
 ductal arches were not visualized due to fetal position.
 No markers associated with aneuploidy were appreciated
 Anterior placenta without previa
 Normal amniotic fluid volume
Recommendations

 Follow-up ultrasounds as clinically indicated.

## 2015-06-11 ENCOUNTER — Ambulatory Visit (INDEPENDENT_AMBULATORY_CARE_PROVIDER_SITE_OTHER): Payer: 59 | Admitting: Obstetrics & Gynecology

## 2015-06-11 ENCOUNTER — Encounter: Payer: Self-pay | Admitting: Obstetrics & Gynecology

## 2015-06-11 VITALS — BP 124/68 | HR 89 | Resp 16 | Ht 65.0 in | Wt 116.0 lb

## 2015-06-11 DIAGNOSIS — Z3041 Encounter for surveillance of contraceptive pills: Secondary | ICD-10-CM | POA: Diagnosis not present

## 2015-06-11 DIAGNOSIS — Z124 Encounter for screening for malignant neoplasm of cervix: Secondary | ICD-10-CM

## 2015-06-11 DIAGNOSIS — Z01419 Encounter for gynecological examination (general) (routine) without abnormal findings: Secondary | ICD-10-CM | POA: Diagnosis not present

## 2015-06-11 DIAGNOSIS — Z0000 Encounter for general adult medical examination without abnormal findings: Secondary | ICD-10-CM

## 2015-06-11 MED ORDER — LEVONORGEST-ETH ESTRAD 91-DAY 0.15-0.03 &0.01 MG PO TABS: 1.0000 | ORAL_TABLET | Freq: Every day | ORAL | Status: DC

## 2015-06-11 NOTE — Progress Notes (Signed)
Subjective:    Angelica Dickerson is a 29 y.o. MW 3P1 (29 yo son Angelica Dickerson) female who presents for an annual exam. The patient has no complaints today. The patient is sexually active. GYN screening history: last pap: was normal. The patient wears seatbelts: yes. The patient participates in regular exercise: yes. Has the patient ever been transfused or tattooed?: no. The patient reports that there is not domestic violence in her life.   Menstrual History: OB History    Gravida Para Term Preterm AB TAB SAB Ectopic Multiple Living   1 1 1  0 0 0 0 0 0 1      Menarche age: [age]  No LMP recorded. Patient is not currently having periods (Reason: Lactating).    The following portions of the patient's history were reviewed and updated as appropriate: allergies, current medications, past family history, past medical history, past social history, past surgical history and problem list.  Review of Systems Pertinent items noted in HPI and remainder of comprehensive ROS otherwise negative. Married for 7 years, some discomfort with sex since delivery, maybe not enough wetness, still breastfeeding. Homemaker.[place] living in [place] currently. Uses the minipill for contraception.   Objective:    BP 124/68 mmHg  Pulse 89  Resp 16  Ht 5\' 5"  (1.651 m)  Wt 116 lb (52.617 kg)  BMI 19.30 kg/m2  Breastfeeding? Yes  General Appearance:    Alert, cooperative, no distress, appears stated age  Head:    Normocephalic, without obvious abnormality, atraumatic  Eyes:    PERRL, conjunctiva/corneas clear, EOM's intact, fundi    benign, both eyes  Ears:    Normal TM's and external ear canals, both ears  Nose:   Nares normal, septum midline, mucosa normal, no drainage    or sinus tenderness  Throat:   Lips, mucosa, and tongue normal; teeth and gums normal  Neck:   Supple, symmetrical, trachea midline, no adenopathy;    thyroid:  no enlargement/tenderness/nodules; no carotid   bruit or JVD  Back:     Symmetric, no  curvature, ROM normal, no CVA tenderness  Lungs:     Clear to auscultation bilaterally, respirations unlabored  Chest Wall:    No tenderness or deformity   Heart:    Regular rate and rhythm, S1 and S2 normal, no murmur, rub   or gallop  Breast Exam:    No tenderness, masses, or nipple abnormality  Abdomen:     Soft, non-tender, bowel sounds active all four quadrants,    no masses, no organomegaly  Genitalia:    Normal female without lesion, discharge or tenderness     Extremities:   Extremities normal, atraumatic, no cyanosis or edema  Pulses:   2+ and symmetric all extremities  Skin:   Skin color, texture, turgor normal, no rashes or lesions  Lymph nodes:   Cervical, supraclavicular, and axillary nodes normal  Neurologic:   CNII-XII intact, normal strength, sensation and reflexes    throughout  .    Assessment:    Healthy female exam.    Plan:     Breast self exam technique reviewed and patient encouraged to perform self-exam monthly.

## 2015-06-15 LAB — CYTOLOGY - PAP

## 2015-06-18 ENCOUNTER — Encounter: Payer: Self-pay | Admitting: *Deleted

## 2015-06-18 ENCOUNTER — Telehealth: Payer: Self-pay | Admitting: *Deleted

## 2015-06-18 NOTE — Telephone Encounter (Signed)
Attempted to contact pt via phone but the wrong number is listed in Epic.  Will mail letter stating that not enough cells were collected and she needs a repeat pap smear.

## 2015-06-21 ENCOUNTER — Encounter: Payer: Self-pay | Admitting: Obstetrics & Gynecology

## 2015-07-17 ENCOUNTER — Encounter: Payer: Self-pay | Admitting: Obstetrics & Gynecology

## 2015-07-20 ENCOUNTER — Other Ambulatory Visit: Payer: Self-pay | Admitting: *Deleted

## 2015-07-20 DIAGNOSIS — Z30011 Encounter for initial prescription of contraceptive pills: Secondary | ICD-10-CM

## 2015-07-20 MED ORDER — NORETHINDRONE 0.35 MG PO TABS: 1.0000 | ORAL_TABLET | Freq: Every day | ORAL | Status: DC

## 2015-07-20 NOTE — Telephone Encounter (Signed)
Pt sent message through my-chart wanting 1 more RF on [employer] sent to [facility].  Pt is in [place] and Mother is coming next week and will bring it to her.  She is in the process of weaning her baby.

## 2016-07-25 ENCOUNTER — Encounter: Payer: Self-pay | Admitting: Obstetrics & Gynecology

## 2016-07-25 ENCOUNTER — Ambulatory Visit (INDEPENDENT_AMBULATORY_CARE_PROVIDER_SITE_OTHER): Payer: BLUE CROSS/BLUE SHIELD | Admitting: Obstetrics & Gynecology

## 2016-07-25 VITALS — BP 137/90 | HR 95 | Resp 16 | Ht 65.0 in | Wt 126.0 lb

## 2016-07-25 DIAGNOSIS — Z01419 Encounter for gynecological examination (general) (routine) without abnormal findings: Secondary | ICD-10-CM | POA: Diagnosis not present

## 2016-07-25 DIAGNOSIS — Z0000 Encounter for general adult medical examination without abnormal findings: Secondary | ICD-10-CM | POA: Diagnosis not present

## 2016-07-25 MED ORDER — LEVONORGEST-ETH ESTRAD 91-DAY 0.15-0.03 &0.01 MG PO TABS: 1.0000 | ORAL_TABLET | Freq: Every day | ORAL | 4 refills | Status: DC

## 2016-07-25 NOTE — Progress Notes (Signed)
Subjective:     Angelica Dickerson is a 31 y.o. female here for a routine exam.  Current complaints:  None--on OCPs and doing well.     Gynecologic History Patient's last menstrual period was 05/11/2016. Contraception: OCP (estrogen/progesterone) Last Pap: 2016. Results were: inadequate specimen Last mammogram: n/a  Obstetric History OB History  Gravida Para Term Preterm AB Living  1 1 1  0 0 1  SAB TAB Ectopic Multiple Live Births  0 0 0 0 1    # Outcome Date GA Lbr Len/2nd Weight Sex Delivery Anes PTL Lv  1 Term 05/31/14 1518w1d 01:08 / 00:28 7 lb 3.3 oz (3.27 kg) M Vag-Spont None  LIV     Birth Comments: WNL       The following portions of the patient's history were reviewed and updated as appropriate: allergies, current medications, past family history, past medical history, past social history, past surgical history and problem list.  Review of Systems Pertinent items noted in HPI and remainder of comprehensive ROS otherwise negative.    Objective:      Vitals:   07/25/16 1451  BP: 137/90  Pulse: 95  Resp: 16  Weight: 126 lb (57.2 kg)  Height: 5\' 5"  (1.651 m)   Vitals:  WNL General appearance: alert, cooperative and no distress  HEENT: Normocephalic, without obvious abnormality, atraumatic Eyes: negative Throat: lips, mucosa, and tongue normal; teeth and gums normal  Respiratory: Clear to auscultation bilaterally  CV: Regular rate and rhythm  Breasts:  Normal appearance, no masses or tenderness, no nipple retraction or dimpling  GI: Soft, non-tender; bowel sounds normal; no masses,  no organomegaly  GU: External Genitalia:  Tanner V, no lesion Urethra:  No prolapse   Vagina: Pink, normal rugae, no blood or discharge  Cervix: No CMT, no lesion  Uterus:  Normal size and contour, non tender  Adnexa: Normal, no masses, non tender  Musculoskeletal: No edema, redness or tenderness in the calves or thighs  Skin: No lesions or rash  Lymphatic: Axillary adenopathy:  none     Psychiatric: Normal mood and behavior   Vitals:   07/25/16 1451  BP: 137/90  Pulse: 95  Resp: 16  Weight: 126 lb (57.2 kg)  Height: 5\' 5"  (1.651 m)    Assessment:    Healthy female exam.    Plan:    Education reviewed: PNV one month b/f conception. Contraception: OCP (estrogen/progesterone). Follow up in: 1 year.

## 2016-07-25 NOTE — Patient Instructions (Addendum)
 Preventive Care 18-39 Years, Female Preventive care refers to lifestyle choices and visits with your health care provider that can promote health and wellness. What does preventive care include?  A yearly physical exam. This is also called an annual well check.  Dental exams once or twice a year.  Routine eye exams. Ask your health care provider how often you should have your eyes checked.  Personal lifestyle choices, including:  Daily care of your teeth and gums.  Regular physical activity.  Eating a healthy diet.  Avoiding tobacco and drug use.  Limiting alcohol use.  Practicing safe sex.  Taking vitamin and mineral supplements as recommended by your health care provider. What happens during an annual well check? The services and screenings done by your health care provider during your annual well check will depend on your age, overall health, lifestyle risk factors, and family history of disease. Counseling  Your health care provider may ask you questions about your:  Alcohol use.  Tobacco use.  Drug use.  Emotional well-being.  Home and relationship well-being.  Sexual activity.  Eating habits.  Work and work environment.  Method of birth control.  Menstrual cycle.  Pregnancy history. Screening  You may have the following tests or measurements:  Height, weight, and BMI.  Diabetes screening. This is done by checking your blood sugar (glucose) after you have not eaten for a while (fasting).  Blood pressure.  Lipid and cholesterol levels. These may be checked every 5 years starting at age 20.  Skin check.  Hepatitis C blood test.  Hepatitis B blood test.  Sexually transmitted disease (STD) testing.  BRCA-related cancer screening. This may be done if you have a family history of breast, ovarian, tubal, or peritoneal cancers.  Pelvic exam and Pap test. This may be done every 3 years starting at age 21. Starting at age 30, this may be done  every 5 years if you have a Pap test in combination with an HPV test. Discuss your test results, treatment options, and if necessary, the need for more tests with your health care provider. Vaccines  Your health care provider may recommend certain vaccines, such as:  Influenza vaccine. This is recommended every year.  Tetanus, diphtheria, and acellular pertussis (Tdap, Td) vaccine. You may need a Td booster every 10 years.  Varicella vaccine. You may need this if you have not been vaccinated.  HPV vaccine. If you are 26 or younger, you may need three doses over 6 months.  Measles, mumps, and rubella (MMR) vaccine. You may need at least one dose of MMR. You may also need a second dose.  Pneumococcal 13-valent conjugate (PCV13) vaccine. You may need this if you have certain conditions and were not previously vaccinated.  Pneumococcal polysaccharide (PPSV23) vaccine. You may need one or two doses if you smoke cigarettes or if you have certain conditions.  Meningococcal vaccine. One dose is recommended if you are age 19-21 years and a first-year college student living in a residence hall, or if you have one of several medical conditions. You may also need additional booster doses.  Hepatitis A vaccine. You may need this if you have certain conditions or if you travel or work in places where you may be exposed to hepatitis A.  Hepatitis B vaccine. You may need this if you have certain conditions or if you travel or work in places where you may be exposed to hepatitis B.  Haemophilus influenzae type b (Hib) vaccine. You may need   this if you have certain risk factors. Talk to your health care provider about which screenings and vaccines you need and how often you need them. This information is not intended to replace advice given to you by your health care provider. Make sure you discuss any questions you have with your health care provider. Document Released: 08/23/2001 Document Revised:  03/16/2016 Document Reviewed: 04/28/2015 Elsevier Interactive Patient Education  2017 Elsevier Inc.  

## 2016-07-28 LAB — CYTOLOGY - PAP
Diagnosis: NEGATIVE
HPV (WINDOPATH): NOT DETECTED

## 2016-08-01 ENCOUNTER — Telehealth: Payer: Self-pay | Admitting: *Deleted

## 2016-08-01 NOTE — Telephone Encounter (Signed)
-----   Message from Lesly DukesKelly H Leggett, MD sent at 08/01/2016  8:37 AM EST ----- Adequacy Satisfactory for evaluation endocervical/transformation zone component PRESENT.  Diagnosis NEGATIVE FOR INTRAEPITHELIAL LESIONS OR MALIGNANCY.  HPV NOT DETECTED  Routine screening in 3 years

## 2016-08-01 NOTE — Telephone Encounter (Signed)
Pt notified of pap results and routine screening in 3 years.

## 2016-08-17 ENCOUNTER — Other Ambulatory Visit: Payer: Self-pay | Admitting: Obstetrics & Gynecology

## 2017-10-06 ENCOUNTER — Ambulatory Visit (INDEPENDENT_AMBULATORY_CARE_PROVIDER_SITE_OTHER): Payer: BLUE CROSS/BLUE SHIELD | Admitting: Certified Nurse Midwife

## 2017-10-06 ENCOUNTER — Encounter: Payer: Self-pay | Admitting: Certified Nurse Midwife

## 2017-10-06 VITALS — BP 124/78 | HR 98 | Resp 16 | Ht 65.5 in | Wt 122.0 lb

## 2017-10-06 DIAGNOSIS — Z0000 Encounter for general adult medical examination without abnormal findings: Secondary | ICD-10-CM | POA: Diagnosis not present

## 2017-10-06 NOTE — Progress Notes (Signed)
Subjective:     Angelica Dickerson is a 32 y.o. female here for a routine exam.  Current complaints: none.  Personal Stopped OCPs in November, plans to attempt pregnancy this month.   Gynecologic History Patient's last menstrual period was 09/08/2017. Contraception: none Last Pap: 07/2016. Results were: normal Last mammogram: n/a  Obstetric History OB History  Gravida Para Term Preterm AB Living  1 1 1  0 0 1  SAB TAB Ectopic Multiple Live Births  0 0 0 0 1    # Outcome Date GA Lbr Len/2nd Weight Sex Delivery Anes PTL Lv  1 Term 05/31/14 6260w1d 01:08 / 00:28 7 lb 3.3 oz (3.27 kg) M Vag-Spont None  LIV     Birth Comments: WNL     The following portions of the patient's history were reviewed and updated as appropriate: allergies, current medications, past family history, past medical history, past social history, past surgical history and problem list.  Review of Systems Pertinent items are noted in HPI.    Objective:    BP 124/78   Pulse 98   Resp 16   Ht 5' 5.5" (1.664 m)   Wt 122 lb (55.3 kg)   LMP 09/08/2017   BMI 19.99 kg/m   Physical Exam  Constitutional: She is oriented to person, place, and time. She appears well-developed and well-nourished. No distress.  HENT:  Head: Normocephalic and atraumatic.  Neck: Normal range of motion.  Cardiovascular: Normal rate, regular rhythm and normal heart sounds.  Respiratory: Effort normal and breath sounds normal. No respiratory distress. She has no wheezes. She has no rales.  GI: Soft. She exhibits no distension and no mass. There is no tenderness. There is no rebound and no guarding.  Genitourinary:  Genitourinary Comments: deferred  Musculoskeletal: Normal range of motion.  Neurological: She is alert and oriented to person, place, and time.  Skin: Skin is warm and dry.  Psychiatric: She has a normal mood and affect.    Assessment:    Healthy female exam   Preconception counseling Plan:  Continue OCPs Notify office  for +HPT  Follow up in: 1 year. or prn

## 2017-12-15 ENCOUNTER — Encounter: Payer: Self-pay | Admitting: *Deleted

## 2017-12-15 DIAGNOSIS — Z3480 Encounter for supervision of other normal pregnancy, unspecified trimester: Secondary | ICD-10-CM | POA: Insufficient documentation

## 2017-12-28 ENCOUNTER — Other Ambulatory Visit: Payer: Self-pay | Admitting: *Deleted

## 2017-12-28 ENCOUNTER — Other Ambulatory Visit: Payer: Self-pay | Admitting: Advanced Practice Midwife

## 2017-12-28 DIAGNOSIS — Z3480 Encounter for supervision of other normal pregnancy, unspecified trimester: Secondary | ICD-10-CM

## 2017-12-29 ENCOUNTER — Ambulatory Visit (INDEPENDENT_AMBULATORY_CARE_PROVIDER_SITE_OTHER): Payer: BLUE CROSS/BLUE SHIELD

## 2017-12-29 ENCOUNTER — Encounter (INDEPENDENT_AMBULATORY_CARE_PROVIDER_SITE_OTHER): Payer: Self-pay

## 2017-12-29 ENCOUNTER — Ambulatory Visit (INDEPENDENT_AMBULATORY_CARE_PROVIDER_SITE_OTHER): Payer: BLUE CROSS/BLUE SHIELD | Admitting: Advanced Practice Midwife

## 2017-12-29 ENCOUNTER — Encounter: Payer: Self-pay | Admitting: Advanced Practice Midwife

## 2017-12-29 DIAGNOSIS — Z3481 Encounter for supervision of other normal pregnancy, first trimester: Secondary | ICD-10-CM

## 2017-12-29 DIAGNOSIS — Z3A09 9 weeks gestation of pregnancy: Secondary | ICD-10-CM

## 2017-12-29 DIAGNOSIS — Z3480 Encounter for supervision of other normal pregnancy, unspecified trimester: Secondary | ICD-10-CM

## 2017-12-29 DIAGNOSIS — Z113 Encounter for screening for infections with a predominantly sexual mode of transmission: Secondary | ICD-10-CM | POA: Diagnosis not present

## 2017-12-29 NOTE — Progress Notes (Signed)
   PRENATAL VISIT NOTE  Subjective:  Angelica Dickerson is a 32 y.o. G2P1001 at 1513w5d by sure LMP being seen today for initial prenatal visit.  She is currently monitored for the following issues for this low-risk pregnancy and has Well female exam with routine gynecological exam and Supervision of other normal pregnancy, antepartum on their problem list.  Patient reports mild nausea without vomiting.   . Vag. Bleeding: None.  Movement: Absent. Denies leaking of fluid.   The following portions of the patient's history were reviewed and updated as appropriate: allergies, current medications, past family history, past medical history, past social history, past surgical history and problem list. Problem list updated.  Objective:   Vitals:   12/29/17 0833  BP: 134/69  Pulse: (!) 108  Weight: 122 lb (55.3 kg)    Fetal Status:     Movement: Absent     VS reviewed, nursing note reviewed,  Constitutional: well developed, well nourished, no distress HEENT: normocephalic CV: normal rate Pulm/chest wall: normal effort Abdomen: soft Neuro: alert and oriented x 3 Skin: warm, dry Psych: affect normal  Pap and well woman exam done  07/25/16, Pap normal with negative HPV.  Preconception counseling visit on 10/06/17, all wnl. Records reviewed.   Assessment and Plan:  Pregnancy: G2P1001 at 5813w5d  1. Supervision of other normal pregnancy, antepartum --Discussed and offered genetic screening options, including Quad screen/AFP, NIPS testing, and option to decline testing.  Discussed anatomy US as genetic screening but US is less sensitive and may miss Down Syndrome and other birth defects. Pt selects to decline genetic testing.  - Babyscripts Schedule Optimization - Urine cytology ancillary only - Culture, OB Urine - Obstetric Panel, Including HIV --Pt doing well, does not desire medical management of nausea.   Preterm labor symptoms and general obstetric precautions including but not limited to  vaginal bleeding, contractions, leaking of fluid and fetal movement were reviewed in detail with the patient. Please refer to After Visit Summary for other counseling recommendations.  No follow-ups on file.  Future Appointments  Date Time Provider Department Center  12/29/2017  9:30 AM [facility] US 1 [facility]  12/29/2017 10:00 AM [facility]- US 1 [facility]    Sharen CounterLisa Leftwich-Kirby, CNM

## 2017-12-30 LAB — OBSTETRIC PANEL, INCLUDING HIV
Antibody Screen: NEGATIVE
BASOS ABS: 0 10*3/uL (ref 0.0–0.2)
Basos: 0 %
EOS (ABSOLUTE): 0.1 10*3/uL (ref 0.0–0.4)
EOS: 2 %
HEMOGLOBIN: 11 g/dL — AB (ref 11.1–15.9)
HEP B S AG: NEGATIVE
HIV Screen 4th Generation wRfx: NONREACTIVE
Hematocrit: 32.2 % — ABNORMAL LOW (ref 34.0–46.6)
IMMATURE GRANS (ABS): 0 10*3/uL (ref 0.0–0.1)
Immature Granulocytes: 0 %
LYMPHS ABS: 1.7 10*3/uL (ref 0.7–3.1)
LYMPHS: 22 %
MCH: 30.8 pg (ref 26.6–33.0)
MCHC: 34.2 g/dL (ref 31.5–35.7)
MCV: 90 fL (ref 79–97)
Monocytes Absolute: 0.4 10*3/uL (ref 0.1–0.9)
Monocytes: 5 %
NEUTROS PCT: 71 %
Neutrophils Absolute: 5.4 10*3/uL (ref 1.4–7.0)
PLATELETS: 204 10*3/uL (ref 150–450)
RBC: 3.57 x10E6/uL — ABNORMAL LOW (ref 3.77–5.28)
RDW: 14.4 % (ref 12.3–15.4)
RPR Ser Ql: NONREACTIVE
Rh Factor: POSITIVE
Rubella Antibodies, IGG: 1.08 index (ref >0.99)
WBC: 7.5 10*3/uL (ref 3.4–10.8)

## 2018-01-01 LAB — URINE CYTOLOGY ANCILLARY ONLY
Chlamydia: NEGATIVE
Neisseria Gonorrhea: NEGATIVE

## 2018-01-01 LAB — URINE CULTURE, OB REFLEX

## 2018-01-01 LAB — CULTURE, OB URINE

## 2018-01-02 DIAGNOSIS — Z3480 Encounter for supervision of other normal pregnancy, unspecified trimester: Secondary | ICD-10-CM

## 2018-01-22 ENCOUNTER — Encounter: Payer: Self-pay | Admitting: Advanced Practice Midwife

## 2018-01-22 ENCOUNTER — Ambulatory Visit (INDEPENDENT_AMBULATORY_CARE_PROVIDER_SITE_OTHER): Payer: BLUE CROSS/BLUE SHIELD | Admitting: Advanced Practice Midwife

## 2018-01-22 DIAGNOSIS — Z3480 Encounter for supervision of other normal pregnancy, unspecified trimester: Secondary | ICD-10-CM

## 2018-01-22 DIAGNOSIS — Z3481 Encounter for supervision of other normal pregnancy, first trimester: Secondary | ICD-10-CM

## 2018-01-22 NOTE — Addendum Note (Signed)
Addended by: Aviva SignsWILLIAMS, Shayaan Parke L on: 01/22/2018 12:25 PM   Modules accepted: Orders

## 2018-01-22 NOTE — Progress Notes (Addendum)
   PRENATAL VISIT NOTE  Subjective:  Angelica Dickerson is a 32 y.o. G2P1001 at 8721w1d being seen today for ongoing prenatal care.  She is currently monitored for the following issues for this low-risk pregnancy and has Supervision of other normal pregnancy, antepartum on their problem list.  Patient reports no complaints and some round ligament pain.   . Vag. Bleeding: None.  Movement: Absent. Denies leaking of fluid.   The following portions of the patient's history were reviewed and updated as appropriate: allergies, current medications, past family history, past medical history, past social history, past surgical history and problem list. Problem list updated.  Objective:   Vitals:   01/22/18 0925  BP: 105/67  Pulse: 83  Weight: 126 lb (57.2 kg)    Fetal Status: Fetal Heart Rate (bpm): 154   Movement: Absent     General:  Alert, oriented and cooperative. Patient is in no acute distress.  Skin: Skin is warm and dry. No rash noted.   Cardiovascular: Normal heart rate noted  Respiratory: Normal respiratory effort, no problems with respiration noted  Abdomen: Soft, gravid, appropriate for gestational age.        Pelvic: Cervical exam deferred        Extremities: Normal range of motion.  Edema: None  Mental Status: Normal mood and affect. Normal behavior. Normal judgment and thought content.   Assessment and Plan:  Pregnancy: G2P1001 at 5721w1d  1. Supervision of other normal pregnancy, antepartum      Discussed early round ligament pain      Discussed pregnancy support belt  Preterm labor symptoms and general obstetric precautions including but not limited to vaginal bleeding, contractions, leaking of fluid and fetal movement were reviewed in detail with the patient. Please refer to After Visit Summary for other counseling recommendations.  Babyscripts optimized schedule  Schedule US for 19-20 weeks  Future Appointments  Date Time Provider Department Center  03/16/2018  9:10  AM [place] [facility]    Wynelle BourgeoisMarie Syrai Gladwin, CNM

## 2018-01-22 NOTE — Progress Notes (Deleted)
   PRENATAL VISIT NOTE  Subjective:  Angelica Dickerson is a 32 y.o. G2P1001 at 3360w1d being seen today for ongoing prenatal care.  She is currently monitored for the following issues for this {Blank single:19197::"high-risk","low-risk"} pregnancy and has Supervision of other normal pregnancy, antepartum on their problem list.  Patient reports {sx:14538}.   . Vag. Bleeding: None.  Movement: Absent. Denies leaking of fluid.   The following portions of the patient's history were reviewed and updated as appropriate: allergies, current medications, past family history, past medical history, past social history, past surgical history and problem list. Problem list updated.  Objective:   Vitals:   01/22/18 0925  BP: 105/67  Pulse: 83  Weight: 126 lb (57.2 kg)    Fetal Status: Fetal Heart Rate (bpm): 154   Movement: Absent     General:  Alert, oriented and cooperative. Patient is in no acute distress.  Skin: Skin is warm and dry. No rash noted.   Cardiovascular: Normal heart rate noted  Respiratory: Normal respiratory effort, no problems with respiration noted  Abdomen: Soft, gravid, appropriate for gestational age.        Pelvic: {Blank single:19197::"Cervical exam performed","Cervical exam deferred"}        Extremities: Normal range of motion.  Edema: None  Mental Status: Normal mood and affect. Normal behavior. Normal judgment and thought content.   Assessment and Plan:  Pregnancy: G2P1001 at 5260w1d  1. Supervision of other normal pregnancy, antepartum ***  {Blank single:19197::"Term","Preterm"} labor symptoms and general obstetric precautions including but not limited to vaginal bleeding, contractions, leaking of fluid and fetal movement were reviewed in detail with the patient. Please refer to After Visit Summary for other counseling recommendations.  Return in about 1 month (around 02/19/2018[place] [facility].  Future Appointments  Date Time Provider Department Center    03/16/2018  9:10 AM Katrinka BlazingSmith, [place] [facility]    Wynelle BourgeoisMarie Mordche Hedglin, CNM

## 2018-01-22 NOTE — Patient Instructions (Signed)
Second Trimester of Pregnancy The second trimester is from week 13 through week 28, month 4 through 6. This is often the time in pregnancy that you feel your best. Often times, morning sickness has lessened or quit. You may have more energy, and you may get hungry more often. Your unborn baby (fetus) is growing rapidly. At the end of the sixth month, he or she is about 9 inches long and weighs about 1 pounds. You will likely feel the baby move (quickening) between 18 and 20 weeks of pregnancy. Follow these instructions at home:  Avoid all smoking, herbs, and alcohol. Avoid drugs not approved by your doctor.  Do not use any tobacco products, including cigarettes, chewing tobacco, and electronic cigarettes. If you need help quitting, ask your doctor. You may get counseling or other support to help you quit.  Only take medicine as told by your doctor. Some medicines are safe and some are not during pregnancy.  Exercise only as told by your doctor. Stop exercising if you start having cramps.  Eat regular, healthy meals.  Wear a good support bra if your breasts are tender.  Do not use hot tubs, steam rooms, or saunas.  Wear your seat belt when driving.  Avoid raw meat, uncooked cheese, and liter boxes and soil used by cats.  Take your prenatal vitamins.  Take 1500-2000 milligrams of calcium daily starting at the 20th week of pregnancy until you deliver your baby.  Try taking medicine that helps you poop (stool softener) as needed, and if your doctor approves. Eat more fiber by eating fresh fruit, vegetables, and whole grains. Drink enough fluids to keep your pee (urine) clear or pale yellow.  Take warm water baths (sitz baths) to soothe pain or discomfort caused by hemorrhoids. Use hemorrhoid cream if your doctor approves.  If you have puffy, bulging veins (varicose veins), wear support hose. Raise (elevate) your feet for 15 minutes, 3-4 times a day. Limit salt in your diet.  Avoid heavy  lifting, wear low heals, and sit up straight.  Rest with your legs raised if you have leg cramps or low back pain.  Visit your dentist if you have not gone during your pregnancy. Use a soft toothbrush to brush your teeth. Be gentle when you floss.  You can have sex (intercourse) unless your doctor tells you not to.  Go to your doctor visits. Get help if:  You feel dizzy.  You have mild cramps or pressure in your lower belly (abdomen).  You have a nagging pain in your belly area.  You continue to feel sick to your stomach (nauseous), throw up (vomit), or have watery poop (diarrhea).  You have bad smelling fluid coming from your vagina.  You have pain with peeing (urination). Get help right away if:  You have a fever.  You are leaking fluid from your vagina.  You have spotting or bleeding from your vagina.  You have severe belly cramping or pain.  You lose or gain weight rapidly.  You have trouble catching your breath and have chest pain.  You notice sudden or extreme puffiness (swelling) of your face, hands, ankles, feet, or legs.  You have not felt the baby move in over an hour.  You have severe headaches that do not go away with medicine.  You have vision changes. This information is not intended to replace advice given to you by your health care provider. Make sure you discuss any questions you have with your health care   provider. Document Released: 09/21/2009 Document Revised: 12/03/2015 Document Reviewed: 08/28/2012 Elsevier Interactive Patient Education  2017 Elsevier Inc.  

## 2018-01-23 NOTE — Addendum Note (Signed)
Addended by: Kathie DikeSOLA, DEANNA J on: 01/23/2018 11:21 AM   Modules accepted: Orders

## 2018-03-05 ENCOUNTER — Ambulatory Visit (HOSPITAL_COMMUNITY)
Admission: RE | Admit: 2018-03-05 | Discharge: 2018-03-05 | Disposition: A | Discharge status: Home / Self Care | Payer: BLUE CROSS/BLUE SHIELD | Source: Ambulatory Visit | Attending: Advanced Practice Midwife | Admitting: Advanced Practice Midwife

## 2018-03-05 DIAGNOSIS — Z3482 Encounter for supervision of other normal pregnancy, second trimester: Secondary | ICD-10-CM | POA: Insufficient documentation

## 2018-03-05 DIAGNOSIS — Z3A19 19 weeks gestation of pregnancy: Secondary | ICD-10-CM | POA: Diagnosis not present

## 2018-03-05 DIAGNOSIS — Z363 Encounter for antenatal screening for malformations: Secondary | ICD-10-CM

## 2018-03-05 DIAGNOSIS — Z3480 Encounter for supervision of other normal pregnancy, unspecified trimester: Secondary | ICD-10-CM

## 2018-03-16 ENCOUNTER — Ambulatory Visit (INDEPENDENT_AMBULATORY_CARE_PROVIDER_SITE_OTHER): Payer: BLUE CROSS/BLUE SHIELD | Admitting: Advanced Practice Midwife

## 2018-03-16 VITALS — BP 111/64 | HR 84 | Wt 130.0 lb

## 2018-03-16 DIAGNOSIS — Z3482 Encounter for supervision of other normal pregnancy, second trimester: Secondary | ICD-10-CM

## 2018-03-16 DIAGNOSIS — Z3480 Encounter for supervision of other normal pregnancy, unspecified trimester: Secondary | ICD-10-CM

## 2018-03-16 NOTE — Patient Instructions (Signed)
AREA PEDIATRIC/FAMILY PRACTICE PHYSICIANS  [facility] [address], [address] [place], [place]  [address] Phone - [phone]   Fax - [phone]  [facility] [address] [place], [place] [address] Phone - [phone]   Fax - [phone]  [facility] [address] [place], [place]  [address] Phone - [phone]   Fax - [phone]  [facility] [address] [place], [place]  [address] Phone - [phone]   Fax - [phone]  [facility] [address] [place], [place]  [address] Phone - [phone]   Fax - [phone]  [facility] [address] [place], [place]  [address] Phone - [phone]   Fax - [phone]  [facility] [address] [place], [place]  [address] Phone - [phone]   Fax - [phone]  [facility] [address] [place], [place]  [address] Phone - [phone]   Fax - [phone]  [facility] [address] [place], [place]  [address] Phone - [phone]   Fax - [phone] [facility] [address] [place], [place]  [address] Phone - [phone]   Fax - [phone]  [facility] [address] [place], [place]  [address] Phone - [phone]   Fax - [phone]  [facility] [address] [place], [place]  [address] Phone - [phone]   Fax - [phone]  [facility] [address] [place], [place]  [address] Phone - [phone]   Fax - [phone]  SHILPA GOSRANI [address] [place], [place]  [address] Phone - [phone]  [facility] [address] [place], [place]  [address] Phone - [phone]   Fax - [phone]  [facility] [address] [place], [place]  [address] Phone - [phone]   Fax - [phone]  [facility] [address] [place], [place]  [address] Phone - [phone]   Fax - [phone]  [facility] [address] [place], [place]  [address] Phone - [phone]   Fax - [phone]   [facility] [address] [place], [place]  [address] Phone - [phone]   Fax - [phone]  [facility] [address] [place], [place]  [address] Phone - [phone]   Fax - [phone]  [facility] RUB[facility] [address] [place], [place]  [address] Phone - [phone]   Fax - [phone]  [facility] [address] [place], [place]  [address] Phone - [phone]   Fax - [phone]  [facility] [address] [place], [place]  [address] Phone - [phone]   Fax - [phone] [facility] [address] [place], [place]  [address] Phone - [phone]   Fax - [phone]  [facility] [address] [place], [place]  [address] Phone - [phone]   Fax - [phone]   [facility] [address] [place], [place]  [address] Phone - [phone]   Fax - [phone]  [facility] Charlene Flemming MD [address]  [place] [address] Phone [phone]  Fax [phone]   Contraception Choices Contraception, also called birth control, refers to methods or devices that prevent   pregnancy. Hormonal methods Contraceptive implant A contraceptive implant is a thin, plastic tube that contains a hormone. It is inserted into the upper part of the arm. It can remain in place for up to 3 years. Progestin-only injections Progestin-only injections are injections of progestin, a synthetic form of the hormone progesterone. They are given every 3 months by a health care  provider. Birth control pills Birth control pills are pills that contain hormones that prevent pregnancy. They must be taken once a day, preferably at the same time each day. Birth control patch The birth control patch contains hormones that prevent pregnancy. It is placed on the skin and must be changed once a week for three weeks and removed on the fourth week. A prescription is needed to use this method of contraception. Vaginal ring A vaginal ring contains hormones that prevent pregnancy. It is placed in the vagina for three weeks and removed on the fourth week. After that, the process is repeated with a new ring. A prescription is needed to use this method of contraception. Emergency contraceptive Emergency contraceptives prevent pregnancy after unprotected sex. They come in pill form and can be taken up to 5 days after sex. They work best the sooner they are taken after having sex. Most emergency contraceptives are available without a prescription. This method should not be used as your only form of birth control. Barrier methods Female condom A female condom is a thin sheath that is worn over the penis during sex. Condoms keep sperm from going inside a woman's body. They can be used with a spermicide to increase their effectiveness. They should be disposed after a single use. Female condom A female condom is a soft, loose-fitting sheath that is put into the vagina before sex. The condom keeps sperm from going inside a woman's body. They should be disposed after a single use. Diaphragm A diaphragm is a soft, dome-shaped barrier. It is inserted into the vagina before sex, along with a spermicide. The diaphragm blocks sperm from entering the uterus, and the spermicide kills sperm. A diaphragm should be left in the vagina for 6-8 hours after sex and removed within 24 hours. A diaphragm is prescribed and fitted by a health care provider. A diaphragm should be replaced every 1-2 years, after giving  birth, after gaining more than 15 lb (6.8 kg), and after pelvic surgery. Cervical cap A cervical cap is a round, soft latex or plastic cup that fits over the cervix. It is inserted into the vagina before sex, along with spermicide. It blocks sperm from entering the uterus. The cap should be left in place for 6-8 hours after sex and removed within 48 hours. A cervical cap must be prescribed and fitted by a health care provider. It should be replaced every 2 years. Sponge A sponge is a soft, circular piece of polyurethane foam with spermicide on it. The sponge helps block sperm from entering the uterus, and the spermicide kills sperm. To use it, you make it wet and then insert it into the vagina. It should be inserted before sex, left in for at least 6 hours after sex, and removed and thrown away within 30 hours. Spermicides Spermicides are chemicals that kill or block sperm from entering the cervix and uterus. They can come as a cream, jelly, suppository, foam, or tablet. A spermicide should be inserted into the vagina with an applicator at least 10-15 minutes before sex to allow time for it to work. The process must be repeated   every time you have sex. Spermicides do not require a prescription. Intrauterine contraception Intrauterine device (IUD) An IUD is a T-shaped device that is put in a woman's uterus. There are two types:  Hormone IUD.This type contains progestin, a synthetic form of the hormone progesterone. This type can stay in place for 3-5 years.  Copper IUD.This type is wrapped in copper wire. It can stay in place for 10 years.  Permanent methods of contraception Female tubal ligation In this method, a woman's fallopian tubes are sealed, tied, or blocked during surgery to prevent eggs from traveling to the uterus. Hysteroscopic sterilization In this method, a small, flexible insert is placed into each fallopian tube. The inserts cause scar tissue to form in the fallopian tubes and block  them, so sperm cannot reach an egg. The procedure takes about 3 months to be effective. Another form of birth control must be used during those 3 months. Female sterilization This is a procedure to tie off the tubes that carry sperm (vasectomy). After the procedure, the man can still ejaculate fluid (semen). Natural planning methods Natural family planning In this method, a couple does not have sex on days when the woman could become pregnant. Calendar method This means keeping track of the length of each menstrual cycle, identifying the days when pregnancy can happen, and not having sex on those days. Ovulation method In this method, a couple avoids sex during ovulation. Symptothermal method This method involves not having sex during ovulation. The woman typically checks for ovulation by watching changes in her temperature and in the consistency of cervical mucus. Post-ovulation method In this method, a couple waits to have sex until after ovulation. Summary  Contraception, also called birth control, means methods or devices that prevent pregnancy.  Hormonal methods of contraception include implants, injections, pills, patches, vaginal rings, and emergency contraceptives.  Barrier methods of contraception can include female condoms, female condoms, diaphragms, cervical caps, sponges, and spermicides.  There are two types of IUDs (intrauterine devices). An IUD can be put in a woman's uterus to prevent pregnancy for 3-5 years.  Permanent sterilization can be done through a procedure for males, females, or both.  Natural family planning methods involve not having sex on days when the woman could become pregnant. This information is not intended to replace advice given to you by your health care provider. Make sure you discuss any questions you have with your health care provider. Document Released: 06/27/2005 Document Revised: 07/30/2016 Document Reviewed: 07/30/2016 Elsevier Interactive  Patient Education  2018 [employer].  Pregnancy and Influenza Influenza, also called the flu, is an infection of the respiratory tract. If you are pregnant, you are more likely to catch the flu. You are also more likely to have a more serious case of the flu. This is because pregnancy lowers your body's ability to fight off infections (it weakens your immune system). It also puts additional stress on your heart and lungs, which makes you more likely to have complications. Having a bad case of the flu, especially with a high fever, can be dangerous for your developing baby. It can cause you to go into early labor. How do people get the flu? The flu is caused by the influenza virus. This virus is common every year in the fall and winter. It spreads when virus particles get passed from person to person. You can get the virus if you are near a sick person who is coughing or sneezing. You can also get the virus  if you touch something that has the virus on it and then touch your face. How can I protect myself against the flu?  Get a flu shot. The best way to prevent the flu is to get a flu shot before flu season starts. The flu shot is not dangerous for your developing baby. It may even help protect your baby from the flu for up to 6 months after birth. The flu shot is one type of flu vaccine. Another type is a nasal spray vaccine. Do not get the nasal spray vaccine. It is not approved for pregnancy.  Do not come in close contact with sick people.  Do not share food, drinks, or utensils with other people.  Wash your hands often. Use hand sanitizer when soap and water are not available. What should I do if I have flu symptoms? If you have any flu symptoms, call your health care provider right away. Flu symptoms include:  Fever or chills.  Muscle aches.  Headache.  Sore throat.  Nasal congestion.  Cough.  Feeling tired.  Loss of appetite.  Vomiting.  Diarrhea.  You may be able to take  an antiviral medicine to keep the flu from becoming severe and to shorten how long it lasts. What should I do at home if I am diagnosed with the flu?  Do not take any medicine, including cold or flu medicine, unless directed by your health care provider.  If you take antiviral medicine, make sure you finish it even if you start to feel better.  Drink enough fluid to keep your urine clear or pale yellow.  Get plenty of rest. When would I seek immediate medical care if I have the flu?  You have trouble breathing.  You have chest pain.  You begin to have labor pains.  You have a high fever that does not go down after you take medicine.  You do not feel your baby move.  You have diarrhea or vomiting that will not go away. This information is not intended to replace advice given to you by your health care provider. Make sure you discuss any questions you have with your health care provider. Document Released: 04/29/2008 Document Revised: 12/03/2015 Document Reviewed: 05/24/2013 Elsevier Interactive Patient Education  2017 [employer].

## 2018-03-16 NOTE — Progress Notes (Signed)
   PRENATAL VISIT NOTE  Subjective:  Angelica Dickerson is a 32 y.o. G2P1001 at [redacted]w[redacted]d being seen today for ongoing prenatal care.  She is currently monitored for the following issues for this low-risk pregnancy and has Supervision of other normal pregnancy, antepartum on their problem list.  Patient reports hip and back pain. Has been going to [facility] w/ good results..  Contractions: Regular. Vag. Bleeding: None.  Movement: Present. Denies leaking of fluid.   The following portions of the patient's history were reviewed and updated as appropriate: allergies, current medications, past family history, past medical history, past social history, past surgical history and problem list. Problem list updated.  Objective:   Vitals:   03/16/18 0920  BP: 111/64  Pulse: 84  Weight: 130 lb (59 kg)    Fetal Status: Fetal Heart Rate (bpm): 152   Movement: Present     General:  Alert, oriented and cooperative. Patient is in no acute distress.  Skin: Skin is warm and dry. No rash noted.   Cardiovascular: Normal heart rate noted  Respiratory: Normal respiratory effort, no problems with respiration noted  Abdomen: Soft, gravid, appropriate for gestational age.  Pain/Pressure: Absent     Pelvic: Cervical exam deferred        Extremities: Normal range of motion.  Edema: None  Mental Status: Normal mood and affect. Normal behavior. Normal judgment and thought content.   Assessment and Plan:  Pregnancy: G2P1001 at [redacted]w[redacted]d  1. Supervision of other normal pregnancy, antepartum  - Babyscripts Program - Discussed TDaP , Flu vaccine (wants to wait until [place])  Preterm labor symptoms and general obstetric precautions including but not limited to vaginal bleeding, contractions, leaking of fluid and fetal movement were reviewed in detail with the patient. Please refer to After Visit Summary for other counseling recommendations.  Return in about 8 weeks (around 05/11/2018) for ROB/GTT.  No future  appointments.  Dorathy Kinsman, CNM

## 2018-05-11 ENCOUNTER — Ambulatory Visit (INDEPENDENT_AMBULATORY_CARE_PROVIDER_SITE_OTHER): Payer: BLUE CROSS/BLUE SHIELD | Admitting: Certified Nurse Midwife

## 2018-05-11 VITALS — BP 112/69 | HR 98 | Wt 139.0 lb

## 2018-05-11 DIAGNOSIS — Z23 Encounter for immunization: Secondary | ICD-10-CM | POA: Diagnosis not present

## 2018-05-11 DIAGNOSIS — Z3480 Encounter for supervision of other normal pregnancy, unspecified trimester: Secondary | ICD-10-CM

## 2018-05-11 DIAGNOSIS — Z3483 Encounter for supervision of other normal pregnancy, third trimester: Secondary | ICD-10-CM

## 2018-05-11 NOTE — Progress Notes (Signed)
Subjective:  Angelica Dickerson is a 32 y.o. G2P1001 at [redacted]w[redacted]d being seen today for ongoing prenatal care.  She is currently monitored for the following issues for this low-risk pregnancy and has Supervision of other normal pregnancy, antepartum on their problem list.  Patient reports no complaints.  Contractions: Not present. Vag. Bleeding: None.  Movement: Present. Denies leaking of fluid.   The following portions of the patient's history were reviewed and updated as appropriate: allergies, current medications, past family history, past medical history, past social history, past surgical history and problem list. Problem list updated.  Objective:   Vitals:   05/11/18 0839  BP: 112/69  Pulse: 98  Weight: 63 kg    Fetal Status: Fetal Heart Rate (bpm): 141 Fundal Height: 26 cm Movement: Present     General:  Alert, oriented and cooperative. Patient is in no acute distress.  Skin: Skin is warm and dry. No rash noted.   Cardiovascular: Normal heart rate noted  Respiratory: Normal respiratory effort, no problems with respiration noted  Abdomen: Soft, gravid, appropriate for gestational age. Pain/Pressure: Absent     Pelvic: Vag. Bleeding: None Vag D/C Character: Thin   Cervical exam deferred        Extremities: Normal range of motion.  Edema: None  Mental Status: Normal mood and affect. Normal behavior. Normal judgment and thought content.   Urinalysis:      Assessment and Plan:  Pregnancy: G2P1001 at [redacted]w[redacted]d  1. Supervision of other normal pregnancy, antepartum - flu vax - 2Hr GTT w/ 1 Hr Carpenter 75 g - CBC - HIV antibody (with reflex) - RPR  Preterm labor symptoms and general obstetric precautions including but not limited to vaginal bleeding, contractions, leaking of fluid and fetal movement were reviewed in detail with the patient. Please refer to After Visit Summary for other counseling recommendations.  Return in about 4 weeks (around 06/08/2018).   Donette Larry,  CNM

## 2018-05-11 NOTE — Addendum Note (Signed)
Addended by: Granville Lewis on: 05/11/2018 09:10 AM   Modules accepted: Orders

## 2018-05-11 NOTE — Patient Instructions (Signed)

## 2018-05-14 LAB — CBC
HEMATOCRIT: 35 % (ref 35.0–45.0)
Hemoglobin: 11.9 g/dL (ref 11.7–15.5)
MCH: 31.5 pg (ref 27.0–33.0)
MCHC: 34 g/dL (ref 32.0–36.0)
MCV: 92.6 fL (ref 80.0–100.0)
MPV: 10.7 fL (ref 7.5–12.5)
Platelets: 134 10*3/uL — ABNORMAL LOW (ref 140–400)
RBC: 3.78 10*6/uL — ABNORMAL LOW (ref 3.80–5.10)
RDW: 12.2 % (ref 11.0–15.0)
WBC: 7.8 10*3/uL (ref 3.8–10.8)

## 2018-05-14 LAB — 2HR GTT W 1 HR, CARPENTER, 75 G
GLUCOSE, 2 HR, GEST: 97 mg/dL (ref 65–152)
Glucose, 1 Hr, Gest: 139 mg/dL (ref 65–179)
Glucose, Fasting, Gest: 76 mg/dL (ref 65–91)

## 2018-05-14 LAB — HIV ANTIBODY (ROUTINE TESTING W REFLEX): HIV 1&2 Ab, 4th Generation: NONREACTIVE

## 2018-05-14 LAB — RPR: RPR: NONREACTIVE

## 2018-05-16 ENCOUNTER — Encounter: Payer: Self-pay | Admitting: Certified Nurse Midwife

## 2018-05-16 DIAGNOSIS — O99119 Other diseases of the blood and blood-forming organs and certain disorders involving the immune mechanism complicating pregnancy, unspecified trimester: Secondary | ICD-10-CM

## 2018-05-16 DIAGNOSIS — D696 Thrombocytopenia, unspecified: Secondary | ICD-10-CM | POA: Insufficient documentation

## 2018-06-05 ENCOUNTER — Ambulatory Visit (INDEPENDENT_AMBULATORY_CARE_PROVIDER_SITE_OTHER): Payer: BLUE CROSS/BLUE SHIELD | Admitting: Advanced Practice Midwife

## 2018-06-05 VITALS — BP 104/54 | HR 85 | Wt 142.0 lb

## 2018-06-05 DIAGNOSIS — O99119 Other diseases of the blood and blood-forming organs and certain disorders involving the immune mechanism complicating pregnancy, unspecified trimester: Secondary | ICD-10-CM

## 2018-06-05 DIAGNOSIS — Z3480 Encounter for supervision of other normal pregnancy, unspecified trimester: Secondary | ICD-10-CM

## 2018-06-05 DIAGNOSIS — O99113 Other diseases of the blood and blood-forming organs and certain disorders involving the immune mechanism complicating pregnancy, third trimester: Secondary | ICD-10-CM

## 2018-06-05 DIAGNOSIS — D696 Thrombocytopenia, unspecified: Secondary | ICD-10-CM

## 2018-06-05 DIAGNOSIS — Z3A32 32 weeks gestation of pregnancy: Secondary | ICD-10-CM

## 2018-06-05 LAB — CBC
HEMATOCRIT: 34.7 % — AB (ref 35.0–45.0)
HEMOGLOBIN: 12.1 g/dL (ref 11.7–15.5)
MCH: 31.7 pg (ref 27.0–33.0)
MCHC: 34.9 g/dL (ref 32.0–36.0)
MCV: 90.8 fL (ref 80.0–100.0)
MPV: 10.8 fL (ref 7.5–12.5)
Platelets: 149 10*3/uL (ref 140–400)
RBC: 3.82 10*6/uL (ref 3.80–5.10)
RDW: 12.5 % (ref 11.0–15.0)
WBC: 8.5 10*3/uL (ref 3.8–10.8)

## 2018-06-05 NOTE — Progress Notes (Signed)
   PRENATAL VISIT NOTE  Subjective:  Angelica Dickerson is a 32 y.o. G2P1001 at 3056w2d being seen today for ongoing prenatal care.  She is currently monitored for the following issues for this low-risk pregnancy and has Supervision of other normal pregnancy, antepartum and Thrombocytopenia affecting pregnancy (HCC) on their problem list.  Patient reports no complaints.  Contractions: Not present. Vag. Bleeding: None.  Movement: Present. Denies leaking of fluid.   The following portions of the patient's history were reviewed and updated as appropriate: allergies, current medications, past family history, past medical history, past social history, past surgical history and problem list. Problem list updated.  Objective:   Vitals:   06/05/18 0926  BP: (!) 104/54  Pulse: 85  Weight: 64.4 kg    Fetal Status: Fetal Heart Rate (bpm): 146   Movement: Present     General:  Alert, oriented and cooperative. Patient is in no acute distress.  Skin: Skin is warm and dry. No rash noted.   Cardiovascular: Normal heart rate noted  Respiratory: Normal respiratory effort, no problems with respiration noted  Abdomen: Soft, gravid, appropriate for gestational age.  Pain/Pressure: Absent     Pelvic: Cervical exam deferred        Extremities: Normal range of motion.  Edema: None  Mental Status: Normal mood and affect. Normal behavior. Normal judgment and thought content.   Assessment and Plan:  Pregnancy: G2P1001 at 3056w2d  1. Supervision of other normal pregnancy, antepartum --Anticipatory guidance about next visits/weeks of pregnancy given.  2. Thrombocytopenia affecting pregnancy (HCC) --Plts 134 on 05/11/18. --Repeat CBC today.   Preterm labor symptoms and general obstetric precautions including but not limited to vaginal bleeding, contractions, leaking of fluid and fetal movement were reviewed in detail with the patient. Please refer to After Visit Summary for other counseling recommendations.    No follow-ups on file.  Future Appointments  Date Time Provider Department Center  07/06/2018  8:15 AM Donette LarryBhambri, Melanie, CNM [facility]    Sharen CounterLisa Leftwich-Kirby, CNM

## 2018-06-05 NOTE — Patient Instructions (Signed)
Third Trimester of Pregnancy The third trimester is from week 28 through week 40 (months 7 through 9). The third trimester is a time when the unborn baby (fetus) is growing rapidly. At the end of the ninth month, the fetus is about 20 inches in length and weighs 6-10 pounds. Body changes during your third trimester Your body will continue to go through many changes during pregnancy. The changes vary from woman to woman. During the third trimester:  Your weight will continue to increase. You can expect to gain 25-35 pounds (11-16 kg) by the end of the pregnancy.  You may begin to get stretch marks on your hips, abdomen, and breasts.  You may urinate more often because the fetus is moving lower into your pelvis and pressing on your bladder.  You may develop or continue to have heartburn. This is caused by increased hormones that slow down muscles in the digestive tract.  You may develop or continue to have constipation because increased hormones slow digestion and cause the muscles that push waste through your intestines to relax.  You may develop hemorrhoids. These are swollen veins (varicose veins) in the rectum that can itch or be painful.  You may develop swollen, bulging veins (varicose veins) in your legs.  You may have increased body aches in the pelvis, back, or thighs. This is due to weight gain and increased hormones that are relaxing your joints.  You may have changes in your hair. These can include thickening of your hair, rapid growth, and changes in texture. Some women also have hair loss during or after pregnancy, or hair that feels dry or thin. Your hair will most likely return to normal after your baby is born.  Your breasts will continue to grow and they will continue to become tender. A yellow fluid (colostrum) may leak from your breasts. This is the first milk you are producing for your baby.  Your belly button may stick out.  You may notice more swelling in your hands,  face, or ankles.  You may have increased tingling or numbness in your hands, arms, and legs. The skin on your belly may also feel numb.  You may feel short of breath because of your expanding uterus.  You may have more problems sleeping. This can be caused by the size of your belly, increased need to urinate, and an increase in your body's metabolism.  You may notice the fetus "dropping," or moving lower in your abdomen (lightening).  You may have increased vaginal discharge.  You may notice your joints feel loose and you may have pain around your pelvic bone.  What to expect at prenatal visits You will have prenatal exams every 2 weeks until week 36. Then you will have weekly prenatal exams. During a routine prenatal visit:  You will be weighed to make sure you and the baby are growing normally.  Your blood pressure will be taken.  Your abdomen will be measured to track your baby's growth.  The fetal heartbeat will be listened to.  Any test results from the previous visit will be discussed.  You may have a cervical check near your due date to see if your cervix has softened or thinned (effaced).  You will be tested for Group B streptococcus. This happens between 35 and 37 weeks.  Your health care provider may ask you:  What your birth plan is.  How you are feeling.  If you are feeling the baby move.  If you have had   any abnormal symptoms, such as leaking fluid, bleeding, severe headaches, or abdominal cramping.  If you are using any tobacco products, including cigarettes, chewing tobacco, and electronic cigarettes.  If you have any questions.  Other tests or screenings that may be performed during your third trimester include:  Blood tests that check for low iron levels (anemia).  Fetal testing to check the health, activity level, and growth of the fetus. Testing is done if you have certain medical conditions or if there are problems during the  pregnancy.  Nonstress test (NST). This test checks the health of your baby to make sure there are no signs of problems, such as the baby not getting enough oxygen. During this test, a belt is placed around your belly. The baby is made to move, and its heart rate is monitored during movement.  What is false labor? False labor is a condition in which you feel small, irregular tightenings of the muscles in the womb (contractions) that usually go away with rest, changing position, or drinking water. These are called Braxton Hicks contractions. Contractions may last for hours, days, or even weeks before true labor sets in. If contractions come at regular intervals, become more frequent, increase in intensity, or become painful, you should see your health care provider. What are the signs of labor?  Abdominal cramps.  Regular contractions that start at 10 minutes apart and become stronger and more frequent with time.  Contractions that start on the top of the uterus and spread down to the lower abdomen and back.  Increased pelvic pressure and dull back pain.  A watery or bloody mucus discharge that comes from the vagina.  Leaking of amniotic fluid. This is also known as your "water breaking." It could be a slow trickle or a gush. Let your health care provider know if it has a color or strange odor. If you have any of these signs, call your health care provider right away, even if it is before your due date. Follow these instructions at home: Medicines  Follow your health care provider's instructions regarding medicine use. Specific medicines may be either safe or unsafe to take during pregnancy.  Take a prenatal vitamin that contains at least 600 micrograms (mcg) of folic acid.  If you develop constipation, try taking a stool softener if your health care provider approves. Eating and drinking  Eat a balanced diet that includes fresh fruits and vegetables, whole grains, good sources of protein  such as meat, eggs, or tofu, and low-fat dairy. Your health care provider will help you determine the amount of weight gain that is right for you.  Avoid raw meat and uncooked cheese. These carry germs that can cause birth defects in the baby.  If you have low calcium intake from food, talk to your health care provider about whether you should take a daily calcium supplement.  Eat four or five small meals rather than three large meals a day.  Limit foods that are high in fat and processed sugars, such as fried and sweet foods.  To prevent constipation: ? Drink enough fluid to keep your urine clear or pale yellow. ? Eat foods that are high in fiber, such as fresh fruits and vegetables, whole grains, and beans. Activity  Exercise only as directed by your health care provider. Most women can continue their usual exercise routine during pregnancy. Try to exercise for 30 minutes at least 5 days a week. Stop exercising if you experience uterine contractions.  Avoid heavy   lifting.  Do not exercise in extreme heat or humidity, or at high altitudes.  Wear low-heel, comfortable shoes.  Practice good posture.  You may continue to have sex unless your health care provider tells you otherwise. Relieving pain and discomfort  Take frequent breaks and rest with your legs elevated if you have leg cramps or low back pain.  Take warm sitz baths to soothe any pain or discomfort caused by hemorrhoids. Use hemorrhoid cream if your health care provider approves.  Wear a good support bra to prevent discomfort from breast tenderness.  If you develop varicose veins: ? Wear support pantyhose or compression stockings as told by your healthcare provider. ? Elevate your feet for 15 minutes, 3-4 times a day. Prenatal care  Write down your questions. Take them to your prenatal visits.  Keep all your prenatal visits as told by your health care provider. This is important. Safety  Wear your seat belt at  all times when driving.  Make a list of emergency phone numbers, including numbers for family, friends, the hospital, and police and fire departments. General instructions  Avoid cat litter boxes and soil used by cats. These carry germs that can cause birth defects in the baby. If you have a cat, ask someone to clean the litter box for you.  Do not travel far distances unless it is absolutely necessary and only with the approval of your health care provider.  Do not use hot tubs, steam rooms, or saunas.  Do not drink alcohol.  Do not use any products that contain nicotine or tobacco, such as cigarettes and e-cigarettes. If you need help quitting, ask your health care provider.  Do not use any medicinal herbs or unprescribed drugs. These chemicals affect the formation and growth of the baby.  Do not douche or use tampons or scented sanitary pads.  Do not cross your legs for long periods of time.  To prepare for the arrival of your baby: ? Take prenatal classes to understand, practice, and ask questions about labor and delivery. ? Make a trial run to the hospital. ? Visit the hospital and tour the maternity area. ? Arrange for maternity or paternity leave through employers. ? Arrange for family and friends to take care of pets while you are in the hospital. ? Purchase a rear-facing car seat and make sure you know how to install it in your car. ? Pack your hospital bag. ? Prepare the baby's nursery. Make sure to remove all pillows and stuffed animals from the baby's crib to prevent suffocation.  Visit your dentist if you have not gone during your pregnancy. Use a soft toothbrush to brush your teeth and be gentle when you floss. Contact a health care provider if:  You are unsure if you are in labor or if your water has broken.  You become dizzy.  You have mild pelvic cramps, pelvic pressure, or nagging pain in your abdominal area.  You have lower back pain.  You have persistent  nausea, vomiting, or diarrhea.  You have an unusual or bad smelling vaginal discharge.  You have pain when you urinate. Get help right away if:  Your water breaks before 37 weeks.  You have regular contractions less than 5 minutes apart before 37 weeks.  You have a fever.  You are leaking fluid from your vagina.  You have spotting or bleeding from your vagina.  You have severe abdominal pain or cramping.  You have rapid weight loss or weight gain.    You have shortness of breath with chest pain.  You notice sudden or extreme swelling of your face, hands, ankles, feet, or legs.  Your baby makes fewer than 10 movements in 2 hours.  You have severe headaches that do not go away when you take medicine.  You have vision changes. Summary  The third trimester is from week 28 through week 40, months 7 through 9. The third trimester is a time when the unborn baby (fetus) is growing rapidly.  During the third trimester, your discomfort may increase as you and your baby continue to gain weight. You may have abdominal, leg, and back pain, sleeping problems, and an increased need to urinate.  During the third trimester your breasts will keep growing and they will continue to become tender. A yellow fluid (colostrum) may leak from your breasts. This is the first milk you are producing for your baby.  False labor is a condition in which you feel small, irregular tightenings of the muscles in the womb (contractions) that eventually go away. These are called Braxton Hicks contractions. Contractions may last for hours, days, or even weeks before true labor sets in.  Signs of labor can include: abdominal cramps; regular contractions that start at 10 minutes apart and become stronger and more frequent with time; watery or bloody mucus discharge that comes from the vagina; increased pelvic pressure and dull back pain; and leaking of amniotic fluid. This information is not intended to replace advice  given to you by your health care provider. Make sure you discuss any questions you have with your health care provider. Document Released: 06/21/2001 Document Revised: 12/03/2015 Document Reviewed: 08/28/2012 Elsevier Interactive Patient Education  2017 [employer]  

## 2018-06-06 ENCOUNTER — Telehealth: Payer: Self-pay | Admitting: Advanced Practice Midwife

## 2018-06-06 ENCOUNTER — Encounter: Payer: Self-pay | Admitting: Advanced Practice Midwife

## 2018-06-06 DIAGNOSIS — O99113 Other diseases of the blood and blood-forming organs and certain disorders involving the immune mechanism complicating pregnancy, third trimester: Secondary | ICD-10-CM

## 2018-06-06 DIAGNOSIS — D696 Thrombocytopenia, unspecified: Secondary | ICD-10-CM | POA: Insufficient documentation

## 2018-06-06 NOTE — Telephone Encounter (Signed)
Left message on pt voicemail regarding lab results. Pt had low platelets of 134 on 28 week labwork on 05/11/18. She has no other symptoms, no bruising, no bleeding.  This is likely gestational thrombocytopenia and benign.  Cannot rule out idiopathic thrombocytopenia but no prior history.  Should monitor CBC postpartum to make sure return to normal.  Pt to call office with any questions.

## 2018-07-05 ENCOUNTER — Encounter: Payer: Self-pay | Admitting: Certified Nurse Midwife

## 2018-07-06 ENCOUNTER — Ambulatory Visit (INDEPENDENT_AMBULATORY_CARE_PROVIDER_SITE_OTHER): Payer: BLUE CROSS/BLUE SHIELD | Admitting: Certified Nurse Midwife

## 2018-07-06 ENCOUNTER — Encounter: Payer: Self-pay | Admitting: Certified Nurse Midwife

## 2018-07-06 VITALS — BP 108/70 | HR 85 | Wt 147.0 lb

## 2018-07-06 DIAGNOSIS — Z3480 Encounter for supervision of other normal pregnancy, unspecified trimester: Secondary | ICD-10-CM

## 2018-07-06 DIAGNOSIS — Z3483 Encounter for supervision of other normal pregnancy, third trimester: Secondary | ICD-10-CM

## 2018-07-06 DIAGNOSIS — O99113 Other diseases of the blood and blood-forming organs and certain disorders involving the immune mechanism complicating pregnancy, third trimester: Secondary | ICD-10-CM

## 2018-07-06 DIAGNOSIS — Z113 Encounter for screening for infections with a predominantly sexual mode of transmission: Secondary | ICD-10-CM | POA: Diagnosis not present

## 2018-07-06 DIAGNOSIS — D696 Thrombocytopenia, unspecified: Secondary | ICD-10-CM

## 2018-07-06 LAB — CBC
HEMATOCRIT: 37 % (ref 35.0–45.0)
Hemoglobin: 13.1 g/dL (ref 11.7–15.5)
MCH: 32.3 pg (ref 27.0–33.0)
MCHC: 35.4 g/dL (ref 32.0–36.0)
MCV: 91.1 fL (ref 80.0–100.0)
MPV: 11.6 fL (ref 7.5–12.5)
Platelets: 119 10*3/uL — ABNORMAL LOW (ref 140–400)
RBC: 4.06 10*6/uL (ref 3.80–5.10)
RDW: 12.3 % (ref 11.0–15.0)
WBC: 9.4 10*3/uL (ref 3.8–10.8)

## 2018-07-06 NOTE — Progress Notes (Signed)
Subjective:  Angelica Dickerson is a 32 y.o. G2P1001 at 4963w5d being seen today for ongoing prenatal care.  She is currently monitored for the following issues for this low-risk pregnancy and has Supervision of other normal pregnancy, antepartum; Thrombocytopenia affecting pregnancy (HCC); and Gestational thrombocytopenia, third trimester (HCC) on their problem list.  Patient reports no complaints.  Contractions: Not present. Vag. Bleeding: None.  Movement: Present. Denies leaking of fluid.   The following portions of the patient's history were reviewed and updated as appropriate: allergies, current medications, past family history, past medical history, past social history, past surgical history and problem list. Problem list updated.  Objective:   Vitals:   07/06/18 0814  BP: 108/70  Pulse: 85  Weight: 66.7 kg    Fetal Status: Fetal Heart Rate (bpm): 136 Fundal Height: 34 cm Movement: Present  Presentation: Vertex  General:  Alert, oriented and cooperative. Patient is in no acute distress.  Skin: Skin is warm and dry. No rash noted.   Cardiovascular: Normal heart rate noted  Respiratory: Normal respiratory effort, no problems with respiration noted  Abdomen: Soft, gravid, appropriate for gestational age. Pain/Pressure: Absent     Pelvic: Vag. Bleeding: None Vag D/C Character: Thin   Cervical exam deferred        Extremities: Normal range of motion.  Edema: None  Mental Status: Normal mood and affect. Normal behavior. Normal judgment and thought content.   Urinalysis:      Assessment and Plan:  Pregnancy: G2P1001 at 6563w5d  1. Supervision of other normal pregnancy, antepartum - Urine cytology ancillary only([facility]) - Culture, Grp B Strep w/Rflx Suscept  2. Gestational thrombocytopenia, third trimester (HCC) - CBC  Preterm labor symptoms and general obstetric precautions including but not limited to vaginal bleeding, contractions, leaking of fluid and fetal movement were  reviewed in detail with the patient. Please refer to After Visit Summary for other counseling recommendations.  Return in about 2 weeks (around 07/20/2018).   Donette LarryBhambri, Caelen Higinbotham, CNM

## 2018-07-06 NOTE — Patient Instructions (Signed)
Braxton Hicks Contractions Contractions of the uterus can occur throughout pregnancy, but they are not always a sign that you are in labor. You may have practice contractions called Braxton Hicks contractions. These false labor contractions are sometimes confused with true labor. What are Braxton Hicks contractions? Braxton Hicks contractions are tightening movements that occur in the muscles of the uterus before labor. Unlike true labor contractions, these contractions do not result in opening (dilation) and thinning of the cervix. Toward the end of pregnancy (32-34 weeks), Braxton Hicks contractions can happen more often and may become stronger. These contractions are sometimes difficult to tell apart from true labor because they can be very uncomfortable. You should not feel embarrassed if you go to the hospital with false labor. Sometimes, the only way to tell if you are in true labor is for your health care provider to look for changes in the cervix. The health care provider will do a physical exam and may monitor your contractions. If you are not in true labor, the exam should show that your cervix is not dilating and your water has not broken. If there are no other health problems associated with your pregnancy, it is completely safe for you to be sent home with false labor. You may continue to have Braxton Hicks contractions until you go into true labor. How to tell the difference between true labor and false labor True labor  Contractions last 30-70 seconds.  Contractions become very regular.  Discomfort is usually felt in the top of the uterus, and it spreads to the lower abdomen and low back.  Contractions do not go away with walking.  Contractions usually become more intense and increase in frequency.  The cervix dilates and gets thinner. False labor  Contractions are usually shorter and not as strong as true labor contractions.  Contractions are usually irregular.  Contractions  are often felt in the front of the lower abdomen and in the groin.  Contractions may go away when you walk around or change positions while lying down.  Contractions get weaker and are shorter-lasting as time goes on.  The cervix usually does not dilate or become thin. Follow these instructions at home:   Take over-the-counter and prescription medicines only as told by your health care provider.  Keep up with your usual exercises and follow other instructions from your health care provider.  Eat and drink lightly if you think you are going into labor.  If Braxton Hicks contractions are making you uncomfortable: ? Change your position from lying down or resting to walking, or change from walking to resting. ? Sit and rest in a tub of warm water. ? Drink enough fluid to keep your urine pale yellow. Dehydration may cause these contractions. ? Do slow and deep breathing several times an hour.  Keep all follow-up prenatal visits as told by your health care provider. This is important. Contact a health care provider if:  You have a fever.  You have continuous pain in your abdomen. Get help right away if:  Your contractions become stronger, more regular, and closer together.  You have fluid leaking or gushing from your vagina.  You pass blood-tinged mucus (bloody show).  You have bleeding from your vagina.  You have low back pain that you never had before.  You feel your baby's head pushing down and causing pelvic pressure.  Your baby is not moving inside you as much as it used to. Summary  Contractions that occur before labor are   called Braxton Hicks contractions, false labor, or practice contractions.  Braxton Hicks contractions are usually shorter, weaker, farther apart, and less regular than true labor contractions. True labor contractions usually become progressively stronger and regular, and they become more frequent.  Manage discomfort from Braxton Hicks contractions  by changing position, resting in a warm bath, drinking plenty of water, or practicing deep breathing. This information is not intended to replace advice given to you by your health care provider. Make sure you discuss any questions you have with your health care provider. Document Released: 11/10/2016 Document Revised: 04/11/2017 Document Reviewed: 11/10/2016 Elsevier Interactive Patient Education  2019 [employer]  

## 2018-07-06 NOTE — Progress Notes (Signed)
Review Birth plan with pt.

## 2018-07-09 ENCOUNTER — Encounter: Payer: Self-pay | Admitting: Certified Nurse Midwife

## 2018-07-09 LAB — CULTURE, STREPTOCOCCUS GRP B W/SUSCEPT
MICRO NUMBER:: 91544788
SPECIMEN QUALITY:: ADEQUATE

## 2018-07-09 LAB — URINE CYTOLOGY ANCILLARY ONLY
Chlamydia: NEGATIVE
NEISSERIA GONORRHEA: NEGATIVE

## 2018-07-11 NOTE — L&D Delivery Note (Addendum)
Delivery Note  At 8:51 AM a viable and healthy female was delivered via Vaginal, Spontaneous (Presentation: Direct OA) via summersault maneuver due to tight nucal cord..  APGAR: 9, 9; weight 8 lb 3 oz (3715 g).  IM pitocin given. Placenta status: Spontaneous, intact.  Cord: 3VC with the following complications: tight nucal.  Cord pH: NA  Anesthesia:  Local Episiotomy: None Lacerations: 3rd degree (3c) Suture Repair: 3.0 vicryl rapide> Dr. Debroah Loop repaired anal sphincter. Ivonne Andrew completed repair  Est. Blood Loss (mL): ~326ml  Mom to postpartum.  Baby to Couplet care / Skin to Skin.  Please schedule this patient for Postpartum visit in: 4 weeks with the following provider: Any provider For C/S patients schedule nurse incision check in weeks 2 weeks: no Low risk pregnancy complicated by: nothing Delivery mode:  SVD Anticipated Birth Control:  Vasectomy PP Procedures needed: None  Schedule Integrated BH visit: no  [place] 07/29/2018, 11:46 PM

## 2018-07-18 ENCOUNTER — Encounter: Payer: Self-pay | Admitting: Certified Nurse Midwife

## 2018-07-18 ENCOUNTER — Encounter: Payer: Self-pay | Admitting: *Deleted

## 2018-07-20 ENCOUNTER — Encounter: Payer: Self-pay | Admitting: Certified Nurse Midwife

## 2018-07-20 ENCOUNTER — Ambulatory Visit (INDEPENDENT_AMBULATORY_CARE_PROVIDER_SITE_OTHER): Payer: BLUE CROSS/BLUE SHIELD | Admitting: Certified Nurse Midwife

## 2018-07-20 VITALS — BP 120/74 | HR 83 | Wt 146.0 lb

## 2018-07-20 DIAGNOSIS — O99119 Other diseases of the blood and blood-forming organs and certain disorders involving the immune mechanism complicating pregnancy, unspecified trimester: Principal | ICD-10-CM

## 2018-07-20 DIAGNOSIS — D696 Thrombocytopenia, unspecified: Secondary | ICD-10-CM

## 2018-07-20 DIAGNOSIS — Z3480 Encounter for supervision of other normal pregnancy, unspecified trimester: Secondary | ICD-10-CM

## 2018-07-20 DIAGNOSIS — O99113 Other diseases of the blood and blood-forming organs and certain disorders involving the immune mechanism complicating pregnancy, third trimester: Secondary | ICD-10-CM

## 2018-07-20 LAB — CBC
HEMATOCRIT: 35.3 % (ref 35.0–45.0)
Hemoglobin: 12.2 g/dL (ref 11.7–15.5)
MCH: 31.2 pg (ref 27.0–33.0)
MCHC: 34.6 g/dL (ref 32.0–36.0)
MCV: 90.3 fL (ref 80.0–100.0)
MPV: 11.8 fL (ref 7.5–12.5)
Platelets: 183 10*3/uL (ref 140–400)
RBC: 3.91 10*6/uL (ref 3.80–5.10)
RDW: 12.1 % (ref 11.0–15.0)
WBC: 9 10*3/uL (ref 3.8–10.8)

## 2018-07-20 NOTE — Progress Notes (Signed)
Subjective:  Angelica Dickerson is a 33 y.o. G2P1001 at [redacted]w[redacted]d being seen today for ongoing prenatal care.  She is currently monitored for the following issues for this low-risk pregnancy and has Supervision of other normal pregnancy, antepartum; Thrombocytopenia affecting pregnancy (HCC); and Gestational thrombocytopenia, third trimester (HCC) on their problem list.  Patient reports no complaints.  Contractions: Irritability. Vag. Bleeding: None.  Movement: Present. Denies leaking of fluid.   The following portions of the patient's history were reviewed and updated as appropriate: allergies, current medications, past family history, past medical history, past social history, past surgical history and problem list. Problem list updated.  Objective:   Vitals:   07/20/18 0921  BP: 120/74  Pulse: 83  Weight: 66.2 kg    Fetal Status: Fetal Heart Rate (bpm): 138 Fundal Height: 36 cm Movement: Present  Presentation: Vertex  General:  Alert, oriented and cooperative. Patient is in no acute distress.  Skin: Skin is warm and dry. No rash noted.   Cardiovascular: Normal heart rate noted  Respiratory: Normal respiratory effort, no problems with respiration noted  Abdomen: Soft, gravid, appropriate for gestational age. Pain/Pressure: Present     Pelvic: Vag. Bleeding: None Vag D/C Character: Thin   Cervical exam deferred        Extremities: Normal range of motion.  Edema: None  Mental Status: Normal mood and affect. Normal behavior. Normal judgment and thought content.   Urinalysis:      Assessment and Plan:  Pregnancy: G2P1001 at [redacted]w[redacted]d  1. Thrombocytopenia affecting pregnancy (HCC) - CBC  2. Supervision of other normal pregnancy, antepartum  Term labor symptoms and general obstetric precautions including but not limited to vaginal bleeding, contractions, leaking of fluid and fetal movement were reviewed in detail with the patient. Please refer to After Visit Summary for other counseling  recommendations.  Return in about 1 week (around 07/27/2018).   Donette Larry, CNM

## 2018-07-27 ENCOUNTER — Ambulatory Visit (INDEPENDENT_AMBULATORY_CARE_PROVIDER_SITE_OTHER): Payer: BLUE CROSS/BLUE SHIELD

## 2018-07-27 VITALS — BP 128/63 | HR 94 | Wt 145.0 lb

## 2018-07-27 DIAGNOSIS — O99113 Other diseases of the blood and blood-forming organs and certain disorders involving the immune mechanism complicating pregnancy, third trimester: Secondary | ICD-10-CM

## 2018-07-27 DIAGNOSIS — Z3480 Encounter for supervision of other normal pregnancy, unspecified trimester: Secondary | ICD-10-CM

## 2018-07-27 DIAGNOSIS — Z3A39 39 weeks gestation of pregnancy: Secondary | ICD-10-CM

## 2018-07-27 DIAGNOSIS — Z3483 Encounter for supervision of other normal pregnancy, third trimester: Secondary | ICD-10-CM

## 2018-07-27 DIAGNOSIS — O99119 Other diseases of the blood and blood-forming organs and certain disorders involving the immune mechanism complicating pregnancy, unspecified trimester: Secondary | ICD-10-CM

## 2018-07-27 DIAGNOSIS — D696 Thrombocytopenia, unspecified: Secondary | ICD-10-CM

## 2018-07-27 NOTE — Progress Notes (Signed)
   PRENATAL VISIT NOTE  Subjective:  Angelica Dickerson is a 33 y.o. G2P1001 at [redacted]w[redacted]d being seen today for ongoing prenatal care.  She is currently monitored for the following issues for this low-risk pregnancy and has Supervision of other normal pregnancy, antepartum; Thrombocytopenia affecting pregnancy (HCC); and Gestational thrombocytopenia, third trimester (HCC) on their problem list.  Patient reports no complaints.  Contractions: Irritability. Vag. Bleeding: None.  Movement: Present. Denies leaking of fluid.   The following portions of the patient's history were reviewed and updated as appropriate: allergies, current medications, past family history, past medical history, past social history, past surgical history and problem list. Problem list updated.  Objective:   Vitals:   07/27/18 0851  BP: 128/63  Pulse: 94  Weight: 145 lb (65.8 kg)    Fetal Status: Fetal Heart Rate (bpm): 134 Fundal Height: 37 cm Movement: Present  Presentation: Vertex  General:  Alert, oriented and cooperative. Patient is in no acute distress.  Skin: Skin is warm and dry. No rash noted.   Cardiovascular: Normal heart rate noted  Respiratory: Normal respiratory effort, no problems with respiration noted  Abdomen: Soft, gravid, appropriate for gestational age.  Pain/Pressure: Present     Pelvic: Cervical exam performed Dilation: 3 Effacement (%): 60 Station: -2  Extremities: Normal range of motion.  Edema: None  Mental Status: Normal mood and affect. Normal behavior. Normal judgment and thought content.   Assessment and Plan:  Pregnancy: G2P1001 at [redacted]w[redacted]d  1. Supervision of other normal pregnancy, antepartum - No complaints. Routine care - IOL scheduled for 41 weeks  2. Thrombocytopenia affecting pregnancy (HCC)  Term labor symptoms and general obstetric precautions including but not limited to vaginal bleeding, contractions, leaking of fluid and fetal movement were reviewed in detail with the  patient. Please refer to After Visit Summary for other counseling recommendations.  Return in about 1 week (around 08/03/2018) for Return OB visit.  Rolm Bookbinder, CNM 07/27/18 9:07 AM

## 2018-07-27 NOTE — Patient Instructions (Signed)

## 2018-07-29 ENCOUNTER — Inpatient Hospital Stay (HOSPITAL_COMMUNITY): Admit: 2018-07-29 | Payer: Self-pay

## 2018-07-29 ENCOUNTER — Other Ambulatory Visit: Payer: Self-pay

## 2018-07-29 ENCOUNTER — Encounter (HOSPITAL_COMMUNITY): Payer: Self-pay

## 2018-07-29 ENCOUNTER — Inpatient Hospital Stay (HOSPITAL_COMMUNITY)
Admission: AD | Admit: 2018-07-29 | Discharge: 2018-07-30 | DRG: 768 | Disposition: A | Discharge status: Home / Self Care | Payer: BLUE CROSS/BLUE SHIELD | Attending: Obstetrics & Gynecology | Admitting: Obstetrics & Gynecology

## 2018-07-29 DIAGNOSIS — Z3483 Encounter for supervision of other normal pregnancy, third trimester: Secondary | ICD-10-CM | POA: Diagnosis present

## 2018-07-29 DIAGNOSIS — D6959 Other secondary thrombocytopenia: Secondary | ICD-10-CM | POA: Diagnosis present

## 2018-07-29 DIAGNOSIS — D696 Thrombocytopenia, unspecified: Secondary | ICD-10-CM

## 2018-07-29 DIAGNOSIS — O9912 Other diseases of the blood and blood-forming organs and certain disorders involving the immune mechanism complicating childbirth: Secondary | ICD-10-CM | POA: Diagnosis present

## 2018-07-29 DIAGNOSIS — Z3490 Encounter for supervision of normal pregnancy, unspecified, unspecified trimester: Secondary | ICD-10-CM | POA: Diagnosis present

## 2018-07-29 DIAGNOSIS — Z3A40 40 weeks gestation of pregnancy: Secondary | ICD-10-CM

## 2018-07-29 DIAGNOSIS — Z3480 Encounter for supervision of other normal pregnancy, unspecified trimester: Secondary | ICD-10-CM

## 2018-07-29 DIAGNOSIS — O99119 Other diseases of the blood and blood-forming organs and certain disorders involving the immune mechanism complicating pregnancy, unspecified trimester: Secondary | ICD-10-CM

## 2018-07-29 DIAGNOSIS — O99113 Other diseases of the blood and blood-forming organs and certain disorders involving the immune mechanism complicating pregnancy, third trimester: Secondary | ICD-10-CM

## 2018-07-29 LAB — CBC
HCT: 36.8 % (ref 36.0–46.0)
Hemoglobin: 12.5 g/dL (ref 12.0–15.0)
MCH: 31.6 pg (ref 26.0–34.0)
MCHC: 34 g/dL (ref 30.0–36.0)
MCV: 93.2 fL (ref 80.0–100.0)
Platelets: 149 K/uL — ABNORMAL LOW (ref 150–400)
RBC: 3.95 MIL/uL (ref 3.87–5.11)
RDW: 12.9 % (ref 11.5–15.5)
WBC: 11.2 K/uL — ABNORMAL HIGH (ref 4.0–10.5)
nRBC: 0 % (ref 0.0–0.2)

## 2018-07-29 LAB — TYPE AND SCREEN
ABO/RH(D): O POS
Antibody Screen: NEGATIVE

## 2018-07-29 LAB — SYPHILIS: RPR W/REFLEX TO RPR TITER AND TREPONEMAL ANTIBODIES, TRADITIONAL SCREENING AND DIAGNOSIS ALGORITHM: RPR Ser Ql: NONREACTIVE

## 2018-07-29 MED ORDER — LIDOCAINE HCL (PF) 1 % IJ SOLN
INTRAMUSCULAR | Status: AC
  Administered 2018-07-29: 30 mL via SUBCUTANEOUS
  Filled 2018-07-29: qty 30

## 2018-07-29 MED ORDER — OXYTOCIN 10 UNIT/ML IJ SOLN
INTRAMUSCULAR | Status: AC
  Administered 2018-07-29: 10 [IU] via INTRAMUSCULAR
  Filled 2018-07-29: qty 1

## 2018-07-29 MED ORDER — LACTATED RINGERS IV SOLN: 500.0000 mL | INTRAVENOUS | Status: DC | PRN

## 2018-07-29 MED ORDER — OXYCODONE-ACETAMINOPHEN 5-325 MG PO TABS: 2.0000 | ORAL_TABLET | ORAL | Status: DC | PRN

## 2018-07-29 MED ORDER — TERBUTALINE SULFATE 1 MG/ML IJ SOLN
0.2500 mg | Freq: Once | INTRAMUSCULAR | Status: DC | PRN
  Filled 2018-07-29: qty 1

## 2018-07-29 MED ORDER — PRENATAL MULTIVITAMIN CH
1.0000 | ORAL_TABLET | Freq: Every day | ORAL | Status: DC
  Administered 2018-07-29 – 2018-07-30 (×2): 1 via ORAL
  Filled 2018-07-29: qty 1

## 2018-07-29 MED ORDER — LIDOCAINE HCL (PF) 1 % IJ SOLN
30.0000 mL | INTRAMUSCULAR | Status: DC | PRN
  Administered 2018-07-29: 30 mL via SUBCUTANEOUS
  Filled 2018-07-29: qty 30

## 2018-07-29 MED ORDER — FLEET ENEMA 7-19 GM/118ML RE ENEM: 1.0000 | ENEMA | RECTAL | Status: DC | PRN

## 2018-07-29 MED ORDER — OXYTOCIN 40 UNITS IN NORMAL SALINE INFUSION - SIMPLE MED
INTRAVENOUS | Status: AC
  Filled 2018-07-29: qty 1000

## 2018-07-29 MED ORDER — OXYTOCIN BOLUS FROM INFUSION: 500.0000 mL | Freq: Once | INTRAVENOUS | Status: DC

## 2018-07-29 MED ORDER — ACETAMINOPHEN 325 MG PO TABS: 650.0000 mg | ORAL_TABLET | ORAL | Status: DC | PRN

## 2018-07-29 MED ORDER — OXYTOCIN 40 UNITS IN NORMAL SALINE INFUSION - SIMPLE MED: 2.5000 [IU]/h | INTRAVENOUS | Status: DC

## 2018-07-29 MED ORDER — SOD CITRATE-CITRIC ACID 500-334 MG/5ML PO SOLN: 30.0000 mL | ORAL | Status: DC | PRN

## 2018-07-29 MED ORDER — ONDANSETRON HCL 4 MG/2ML IJ SOLN: 4.0000 mg | Freq: Four times a day (QID) | INTRAMUSCULAR | Status: DC | PRN

## 2018-07-29 MED ORDER — IBUPROFEN 600 MG PO TABS
600.0000 mg | ORAL_TABLET | Freq: Four times a day (QID) | ORAL | Status: DC
  Administered 2018-07-29 – 2018-07-30 (×5): 600 mg via ORAL
  Filled 2018-07-29 (×4): qty 1

## 2018-07-29 MED ORDER — OXYTOCIN 40 UNITS IN NORMAL SALINE INFUSION - SIMPLE MED: 1.0000 m[IU]/min | INTRAVENOUS | Status: DC

## 2018-07-29 MED ORDER — WITCH HAZEL-GLYCERIN EX PADS: 1.0000 "application " | MEDICATED_PAD | CUTANEOUS | Status: DC | PRN

## 2018-07-29 MED ORDER — OXYCODONE-ACETAMINOPHEN 5-325 MG PO TABS: 1.0000 | ORAL_TABLET | ORAL | Status: DC | PRN

## 2018-07-29 MED ORDER — OXYTOCIN 10 UNIT/ML IJ SOLN
10.0000 [IU] | Freq: Once | INTRAMUSCULAR | Status: AC
  Administered 2018-07-29: 10 [IU] via INTRAMUSCULAR

## 2018-07-29 MED ORDER — ONDANSETRON HCL 4 MG PO TABS: 4.0000 mg | ORAL_TABLET | ORAL | Status: DC | PRN

## 2018-07-29 MED ORDER — SIMETHICONE 80 MG PO CHEW: 80.0000 mg | CHEWABLE_TABLET | ORAL | Status: DC | PRN

## 2018-07-29 MED ORDER — MEASLES, MUMPS & RUBELLA VAC IJ SOLR
0.5000 mL | Freq: Once | INTRAMUSCULAR | Status: DC
  Filled 2018-07-29: qty 0.5

## 2018-07-29 MED ORDER — FERROUS SULFATE 325 (65 FE) MG PO TABS
325.0000 mg | ORAL_TABLET | Freq: Two times a day (BID) | ORAL | Status: DC
  Administered 2018-07-29 – 2018-07-30 (×2): 325 mg via ORAL
  Filled 2018-07-29 (×2): qty 1

## 2018-07-29 MED ORDER — ONDANSETRON HCL 4 MG/2ML IJ SOLN: 4.0000 mg | INTRAMUSCULAR | Status: DC | PRN

## 2018-07-29 MED ORDER — DIBUCAINE 1 % RE OINT: 1.0000 "application " | TOPICAL_OINTMENT | RECTAL | Status: DC | PRN

## 2018-07-29 MED ORDER — TETANUS-DIPHTH-ACELL PERTUSSIS 5-2.5-18.5 LF-MCG/0.5 IM SUSP: 0.5000 mL | Freq: Once | INTRAMUSCULAR | Status: DC

## 2018-07-29 MED ORDER — DIPHENHYDRAMINE HCL 25 MG PO CAPS: 25.0000 mg | ORAL_CAPSULE | Freq: Four times a day (QID) | ORAL | Status: DC | PRN

## 2018-07-29 MED ORDER — DOCUSATE SODIUM 100 MG PO CAPS
100.0000 mg | ORAL_CAPSULE | Freq: Two times a day (BID) | ORAL | Status: DC
  Administered 2018-07-29 – 2018-07-30 (×2): 100 mg via ORAL
  Filled 2018-07-29 (×2): qty 1

## 2018-07-29 MED ORDER — COCONUT OIL OIL: 1.0000 "application " | TOPICAL_OIL | Status: DC | PRN

## 2018-07-29 MED ORDER — LACTATED RINGERS IV SOLN: INTRAVENOUS | Status: DC

## 2018-07-29 MED ORDER — ZOLPIDEM TARTRATE 5 MG PO TABS: 5.0000 mg | ORAL_TABLET | Freq: Every evening | ORAL | Status: DC | PRN

## 2018-07-29 MED ORDER — MAGNESIUM HYDROXIDE 400 MG/5ML PO SUSP: 30.0000 mL | ORAL | Status: DC | PRN

## 2018-07-29 MED ORDER — BENZOCAINE-MENTHOL 20-0.5 % EX AERO
1.0000 "application " | INHALATION_SPRAY | CUTANEOUS | Status: DC | PRN
  Administered 2018-07-29: 1 via TOPICAL
  Filled 2018-07-29: qty 56

## 2018-07-29 NOTE — Anesthesia Pain Management Evaluation Note (Signed)
  CRNA Pain Management Visit Note  Patient: Angelica Dickerson, 33 y.o., female  "Hello I am a member of the anesthesia team at [facility]. We have an anesthesia team available at all times to provide care throughout the hospital, including epidural management and anesthesia for C-section. I don't know your plan for the delivery whether it a natural birth, water birth, IV sedation, nitrous supplementation, doula or epidural, but we want to meet your pain goals."   1.Was your pain managed to your expectations on prior hospitalizations?   Did not ask  2.What is your expectation for pain management during this hospitalization?     Labor support without medications  3.How can we help you reach that goal? Pain control if desired  Record the patient's initial score and the patient's pain goal.   Pain: 7  Pain Goal: 10 [facility] wants you to be able to say your pain was always managed very well.  Angelica Dickerson 07/29/2018

## 2018-07-29 NOTE — H&P (Signed)
LABOR AND DELIVERY ADMISSION HISTORY AND PHYSICAL NOTE  Angelica Dickerson is a 33 y.o. female G2P1001 with IUP at [redacted]w[redacted]d by LMP presenting for SOL. Contractions became regular at 2300 Dickerson 1/18.  She reports positive fetal movement. She denies leakage of fluid or vaginal bleeding.  Prenatal History/Complications: PNC at [facility] Pregnancy complications:  - gestational thrombocytopenia   Past Medical History: Past Medical History:  Diagnosis Date  . History of shingles     Past Surgical History: Past Surgical History:  Procedure Laterality Date  . WISDOM TOOTH EXTRACTION  2009    Obstetrical History: OB History    Gravida  2   Para  1   Term  1   Preterm  0   AB  0   Living  1     SAB  0   TAB  0   Ectopic  0   Multiple  0   Live Births  1           Social History: Social History   Socioeconomic History  . Marital status: Married    Spouse name: Not Dickerson file  . Number of children: Not Dickerson file  . Years of education: Not Dickerson file  . Highest education level: Not Dickerson file  Occupational History  . Occupation: Environmental manager  Social Needs  . Financial resource strain: Not Dickerson file  . Food insecurity:    Worry: Not Dickerson file    Inability: Not Dickerson file  . Transportation needs:    Medical: Not Dickerson file    Non-medical: Not Dickerson file  Tobacco Use  . Smoking status: Never Smoker  . Smokeless tobacco: Never Used  Substance and Sexual Activity  . Alcohol use: Yes    Alcohol/week: 0.0 standard drinks    Comment: Not during pregnancy  . Drug use: No  . Sexual activity: Yes    Partners: Male    Birth control/protection: None    Comment: last IC-one week ago  Lifestyle  . Physical activity:    Days per week: Not Dickerson file    Minutes per session: Not Dickerson file  . Stress: Not Dickerson file  Relationships  . Social connections:    Talks Dickerson phone: Not Dickerson file    Gets together: Not Dickerson file    Attends religious service: Not Dickerson file    Active member of club or organization: Not  Dickerson file    Attends meetings of clubs or organizations: Not Dickerson file    Relationship status: Not Dickerson file  Other Topics Concern  . Not Dickerson file  Social History Narrative  . Not Dickerson file    Family History: Family History  Problem Relation Age of Onset  . Hyperlipidemia Father   . Diabetes Paternal Grandfather   . Hyperlipidemia Paternal Grandfather     Allergies: Allergies  Allergen Reactions  . Amoxicillin Hives    Medications Prior to Admission  Medication Sig Dispense Refill Last Dose  . Lactobacillus (PROBIOTIC ACIDOPHILUS PO) Take by mouth.   Taking  . Prenatal Vit-Fe Fumarate-FA (PRENATAL MULTIVITAMIN) TABS tablet Take 1 tablet by mouth daily at 12 noon.   Taking     Review of Systems  All systems reviewed and negative except as stated in HPI  Physical Exam Blood pressure 122/78, pulse 64, temperature (!) 96.3 F (35.7 C), temperature source Oral, resp. rate 16, height 5\' 6"  (1.676 m), weight 67.1 kg, last menstrual period 10/22/2017, SpO2 99 %, currently breastfeeding. General appearance: alert, oriented,  NAD Lungs: normal respiratory effort Heart: regular rate Abdomen: soft, non-tender; gravid, FH appropriate for GA Extremities: No calf swelling or tenderness Presentation: cephalic Fetal monitoring: 120 bpm, moderate variability, +acels, no decels  Uterine activity: q1-4 min  Dilation: 6.5(6.5-7) Effacement (%): 70 Station: 0 Exam by:: Adah Perl RN  Prenatal labs: ABO, Rh: O/Positive/-- (06/21 0849) Antibody: Negative (06/21 0849) Rubella: 1.08 (06/21 0849) RPR: NON-REACTIVE (11/01 0825)  HBsAg: Negative (06/21 0849)  HIV: NON-REACTIVE (11/01 0825)  GC/Chlamydia: Negative  GBS:   Negative  2-hr GTT: Normal  Genetic screening:  Declined  Anatomy US: Normal   Prenatal Transfer Tool  Maternal Diabetes: No Genetic Screening: Declined Maternal Ultrasounds/Referrals: Normal Fetal Ultrasounds or other Referrals:  None Maternal Substance Abuse:   No Significant Maternal Medications:  None Significant Maternal Lab Results: Lab values include: Group B Strep negative  No results found for this or any previous visit (from the past 24 hour(s)).  Patient Active Problem List   Diagnosis Date Noted  . Encounter for induction of labor 07/29/2018  . Gestational thrombocytopenia, third trimester (HCC) 06/06/2018  . Thrombocytopenia affecting pregnancy (HCC) 05/16/2018  . Supervision of other normal pregnancy, antepartum 12/15/2017    Assessment: Angelica Dickerson is a 33 y.o. G2P1001 at [redacted]w[redacted]d here for SOL.   #Labor: Expectant management. Patient wishes to have low intervention labor and delivery. Will hold off Dickerson AROM at present. Wishes to avoid IV access. Discussed risks/benefits. Patient is not at significantly high PP hemorrhage risk. Will plan to give IM Pitocin after delivery for active third stage management.  #Pain: Maternal support. Declines epidural.  #FWB: Cat I  #ID:  GBS neg  #MOF: Breast  #MOC: POPs and vasectomy  #Circ:  Sex unknown   De Hollingshead 07/29/2018, 2:13 AM

## 2018-07-29 NOTE — MAU Note (Signed)
Irregular ctxs earlier Sat. Took warm bath and subsided but then ctxs came back stronger about 14mn. Denies LOF or bleeding. 3-4cm Friday. First delivery was quick

## 2018-07-29 NOTE — Lactation Note (Signed)
This note was copied from a baby's chart. Lactation Consultation Note  Patient Name: [name] Date: 07/29/2018    P2 mom with now 33 year old that she breastfed 14 months.  Mom said she recently fed infant but does have a few questions.  Mom used a NS with previous child and asked LC if she should begin using NS with newborn on right.  Mom is having some trouble with latching infant to right side.    Mom knows how to hand exp. And sees colostrum when expressing.  LC examined mom's breast and mom had a 1 inch long reddened bruised area above the nipple on the right side and the left side was red on nipple and around.    LC offered to assist with latch when hearing screen was finished but mom chose to call out if needed or desired assistance with latch.  LC suggested hand exp. Prior to latch and to try to switch positions and bf in football position instead of cradle hold.     Positioning and pillow support reviewed and coconut oil offered for nipples but mom declined saying she had her own in her bag.  LC offered a hand pump to pre pump prior to latching on right side but mom declined.   BF basics reviewed with mom; at least 8-12 feeds in 24 hours, feeding cues, and STS.  Lactation brochure given and support group and out patient info shared.  LC encouraged mom to call out with assistance or questions.  Maternal Data    Feeding    LATCH Score                   Interventions    Lactation Tools Discussed/Used     Consult Status      Maryruth Hancock [facility] 07/29/2018, 4:34 PM

## 2018-07-29 NOTE — Progress Notes (Signed)
LABOR PROGRESS NOTE  Angelica Dickerson is a 33 y.o. G2P1001 at [redacted]w[redacted]d  admitted for SOL.   Subjective: Patient ambulating around room. Fairly comfortable with contractions. Wants to discuss AROM.   Objective: BP 122/73   Pulse 66   Temp 98.2 F (36.8 C) (Oral)   Resp 16   Ht 5\' 6"  (1.676 m)   Wt 67.1 kg   LMP 10/22/2017   SpO2 99%   BMI 23.89 kg/m  or  Vitals:   07/29/18 0502 07/29/18 0606 07/29/18 0706 07/29/18 0710  BP: (!) 108/53 113/66 120/75 122/73  Pulse: 66 75 64 66  Resp: 16 16 16 16   Temp:    98.2 F (36.8 C)  TempSrc:    Oral  SpO2:      Weight:      Height:        Dilation: 7 Effacement (%): 70 Cervical Position: Posterior Station: -1 Presentation: Vertex Exam by:: Dr. Earlene Plater FHT: baseline rate 120, moderate varibility, +acel, no decel Toco: q2-5 min   Labs: Lab Results  Component Value Date   WBC 11.2 (H) 07/29/2018   HGB 12.5 07/29/2018   HCT 36.8 07/29/2018   MCV 93.2 07/29/2018   PLT 149 (L) 07/29/2018    Patient Active Problem List   Diagnosis Date Noted  . Encounter for induction of labor 07/29/2018  . Gestational thrombocytopenia, third trimester (HCC) 06/06/2018  . Thrombocytopenia affecting pregnancy (HCC) 05/16/2018  . Supervision of other normal pregnancy, antepartum 12/15/2017    Assessment / Plan: 33 y.o. G2P1001 at [redacted]w[redacted]d here for SOL.   Labor: No cervical change since admission. Patient agreeable to AROM. AROM performed at 0700 with clear fluid.  Fetal Wellbeing:  Cat I  Pain Control:  Maternal support.  Anticipated MOD:  NSVD   Marcy Siren, D.O. OB Fellow  07/29/2018, 7:17 AM

## 2018-07-30 MED ORDER — DOCUSATE SODIUM 100 MG PO CAPS: 100.0000 mg | ORAL_CAPSULE | Freq: Two times a day (BID) | ORAL | 0 refills | Status: DC

## 2018-07-30 MED ORDER — POLYETHYLENE GLYCOL 3350 17 G PO PACK
17.0000 g | PACK | Freq: Every day | ORAL | Status: DC
  Filled 2018-07-30 (×2): qty 1

## 2018-07-30 MED ORDER — IBUPROFEN 600 MG PO TABS: 600.0000 mg | ORAL_TABLET | Freq: Four times a day (QID) | ORAL | 0 refills | Status: DC

## 2018-07-30 MED ORDER — POLYETHYLENE GLYCOL 3350 17 G PO PACK: PACK | ORAL | 0 refills | Status: DC

## 2018-07-30 NOTE — Discharge Summary (Addendum)
OB Discharge Summary     Patient Name: Angelica Dickerson DOB: 1986/04/13 MRN: [id number]  Date of admission: 07/29/2018 Delivering MD: Dorathy Kinsman   Date of discharge: 07/30/2018  Admitting diagnosis: 40 wk ctx and pressure Intrauterine pregnancy: [redacted]w[redacted]d     Secondary diagnosis:  Active Problems:   Encounter for induction of labor   Vaginal delivery   Third degree laceration of perineum, type 3c  Additional problems: Gestational Thrombocytopenia     Discharge diagnosis: Term Pregnancy Delivered                                                                                                Post partum procedures:None  Augmentation: AROM and Pitocin  Complications: None  Hospital course:  Onset of Labor With Vaginal Delivery     33 y.o. yo G2P1001 at [redacted]w[redacted]d was admitted in Latent Labor on 07/29/2018. Patient had an uncomplicated labor course as follows:  Membrane Rupture Time/Date: 7:02 AM ,07/29/2018   Intrapartum Procedures: Episiotomy: None [1]                                         Lacerations:  3rd degree [4]  Patient had a delivery of a Viable infant. 07/29/2018  Information for the patient's newborn:  Garcia, Medema [[id number]]  Delivery Method: Vag-Spont    Pateint had an uncomplicated postpartum course.  She is ambulating, tolerating a regular diet, passing flatus, and urinating well. Patient is discharged home in stable condition on 07/30/18.   Physical exam  Vitals:   07/29/18 1600 07/29/18 2004 07/29/18 2322 07/30/18 0547  BP: 116/67 102/65 102/65 103/61  Pulse: 71 67 67 67  Resp: 18 18 18 18   Temp: 98.3 F (36.8 C) (!) 97.4 F (36.3 C) 98.6 F (37 C) 97.6 F (36.4 C)  TempSrc: Oral Oral Oral Oral  SpO2:      Weight:      Height:       General: alert, cooperative and no distress Lochia: appropriate Uterine Fundus: firm Incision: 3rd-degree lac healing well without sign of drainage or infection DVT Evaluation: No evidence of DVT seen on  physical exam. Negative Homan's sign. No cords or calf tenderness. No significant calf/ankle edema. Labs: Lab Results  Component Value Date   WBC 11.2 (H) 07/29/2018   HGB 12.5 07/29/2018   HCT 36.8 07/29/2018   MCV 93.2 07/29/2018   PLT 149 (L) 07/29/2018   No flowsheet data found.  Discharge instruction: per After Visit Summary and "Baby and Me Booklet".  After visit meds:  Allergies as of 07/30/2018      Reactions   Amoxicillin Hives      Medication List    TAKE these medications   docusate sodium 100 MG capsule Commonly known as:  COLACE Take 1 capsule (100 mg total) by mouth 2 (two) times daily.   prenatal multivitamin Tabs tablet Take 1 tablet by mouth daily at 12 noon.   PROBIOTIC ACIDOPHILUS PO Take 1 Can by mouth daily.  Diet: routine diet  Activity: Advance as tolerated. Pelvic rest for 6 weeks.   Outpatient follow up: 1-week for lac check with MD Follow up Appt:No future appointments. Follow up Visit:No follow-ups on file.  Postpartum contraception: Progesterone only pills and Vasectomy  Newborn Data: Live born female  Birth Weight: 8 lb 3 oz (3715 g) APGAR: 9, 9  Newborn Delivery   Birth date/time:  07/29/2018 08:51:00 Delivery type:  Vaginal, Spontaneous     Baby Feeding: Breast Disposition:home with mother   07/30/2018 Dollene Cleveland, DO   OB FELLOW DISCHARGE ATTESTATION  I have seen and examined this patient and agree with above documentation in the resident's note.   Marcy Siren, D.O. OB Fellow  07/30/2018, 8:33 PM

## 2018-08-03 ENCOUNTER — Encounter: Payer: BLUE CROSS/BLUE SHIELD | Admitting: Advanced Practice Midwife

## 2018-08-05 ENCOUNTER — Inpatient Hospital Stay (HOSPITAL_COMMUNITY): Admission: RE | Admit: 2018-08-05 | Payer: BLUE CROSS/BLUE SHIELD | Source: Ambulatory Visit

## 2018-08-31 ENCOUNTER — Encounter: Payer: Self-pay | Admitting: Certified Nurse Midwife

## 2018-08-31 ENCOUNTER — Ambulatory Visit (INDEPENDENT_AMBULATORY_CARE_PROVIDER_SITE_OTHER): Payer: BLUE CROSS/BLUE SHIELD | Admitting: Certified Nurse Midwife

## 2018-08-31 VITALS — BP 105/62 | HR 96 | Resp 16 | Ht 65.0 in | Wt 130.0 lb

## 2018-08-31 DIAGNOSIS — Z1389 Encounter for screening for other disorder: Secondary | ICD-10-CM

## 2018-08-31 DIAGNOSIS — Z30011 Encounter for initial prescription of contraceptive pills: Secondary | ICD-10-CM

## 2018-08-31 MED ORDER — NORETHINDRONE 0.35 MG PO TABS: 1.0000 | ORAL_TABLET | Freq: Every day | ORAL | 5 refills | Status: DC

## 2018-08-31 NOTE — Progress Notes (Signed)
Post Partum Exam  Angelica Dickerson is a 33 y.o. G49P1001 female who presents for a postpartum visit. She is 4 weeks postpartum following a spontaneous vaginal delivery with 3rd degree laceration. I have fully reviewed the prenatal and intrapartum course. The delivery was at 40 w gestational weeks.  Anesthesia: none. Postpartum course has been unremarkable. Baby's course has been unremarkable. Baby is feeding by breast. Bleeding pink. Bowel function is normal. Bladder function is normal. Patient is not sexually active. Contraception method is oral progesterone-only contraceptive and vasectomy. Postpartum depression screening:neg  The following portions of the patient's history were reviewed and updated as appropriate: allergies, current medications, past family history, past medical history, past social history, past surgical history and problem list. Last pap smear done 07/2016 and was Normal  Review of Systems Pertinent items are noted in HPI.    Objective:  Blood pressure 105/62, pulse 96, resp. rate 16, height 5\' 5"  (1.651 m), weight 59 kg, last menstrual period 10/22/2017, currently breastfeeding.  General:  alert, cooperative and no distress   Breasts:  not examined  Lungs: normal rate and effort  Heart:  normal rate  Abdomen: not examined   Vulva:  normal  Vagina: normal vagina and laceration well healed, no erythema  Cervix:  normal  Corpus: normal  Adnexa:  not evaluated  Rectal Exam: Not performed.        Assessment:   Normal postpartum exam Contraceptive management- discussed warning signs Plan:   1. Contraception: oral progesterone-only contraceptive until Vasectomy 2. Follow up in: 1 year or as needed.

## 2018-11-01 ENCOUNTER — Encounter: Payer: Self-pay | Admitting: Certified Nurse Midwife

## 2018-11-05 ENCOUNTER — Other Ambulatory Visit: Payer: Self-pay | Admitting: *Deleted

## 2018-11-05 MED ORDER — NORETHINDRONE 0.35 MG PO TABS: 1.0000 | ORAL_TABLET | Freq: Every day | ORAL | 3 refills | Status: DC

## 2018-11-05 NOTE — Telephone Encounter (Signed)
90 day RX of Norlyda sent to [facility] per Donette Larry, CNM

## 2019-06-09 IMAGING — US US OB TRANSVAGINAL
2 series · 14 of 28 positions shown · non-contrast
Comparison: None.

CLINICAL DATA: Unsure of LMP.

EXAM:
OBSTETRIC <14 WK US AND TRANSVAGINAL OB US
TECHNIQUE: Both transabdominal and transvaginal ultrasound examinations were
performed for complete evaluation of the gestation as well as the
maternal uterus, adnexal regions, and pelvic cul-de-sac.
Transvaginal technique was performed to assess early pregnancy.

[Series 1: us ob transvaginal · 0.18mm/px · 13 of 58 slices shown (1 of 2)]
[im 3/58]
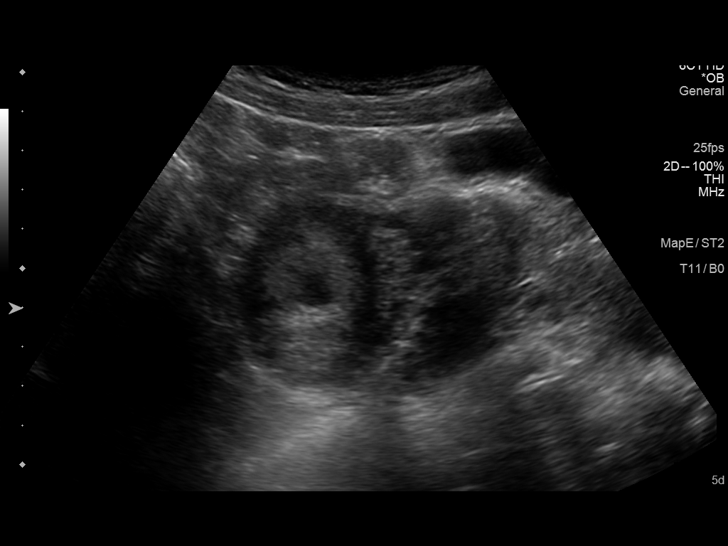
[im 7/58]
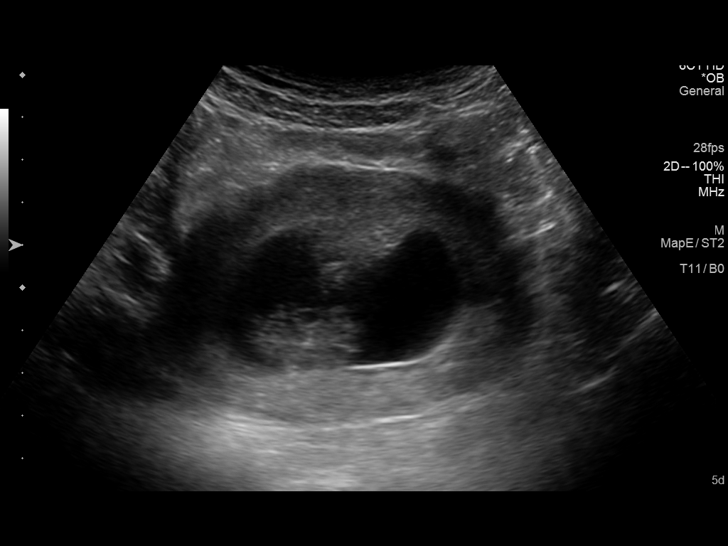
[im 11/58]
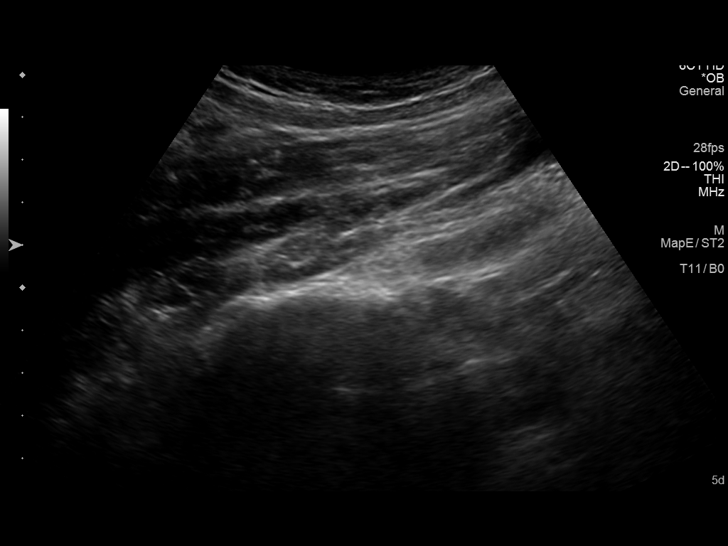
[im 16/58]
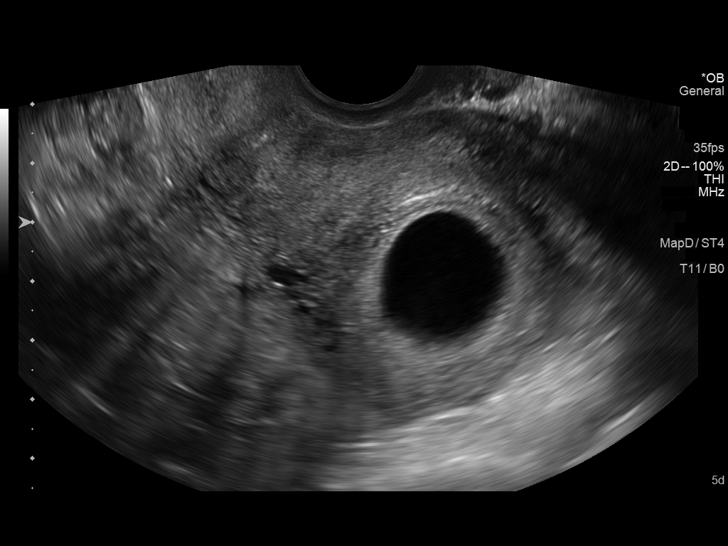
[im 20/58]
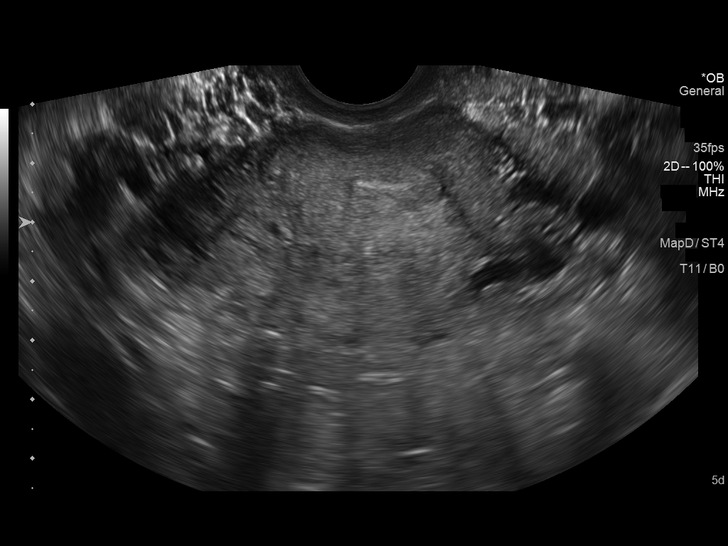
[im 25/58]
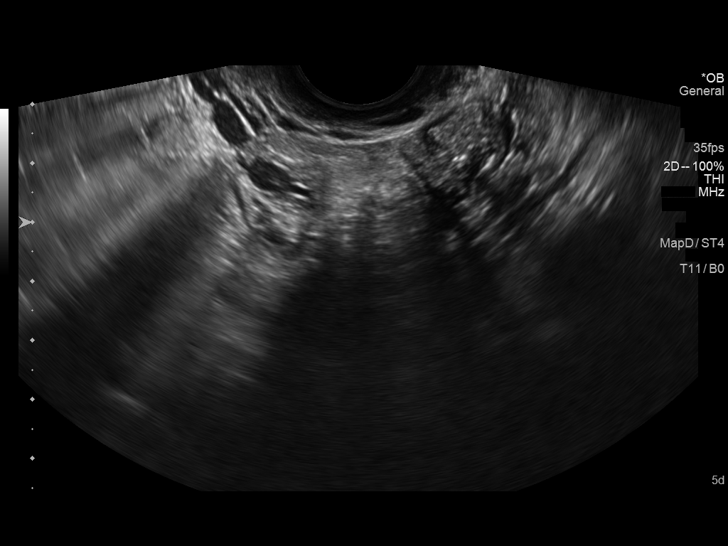
[im 29/58]
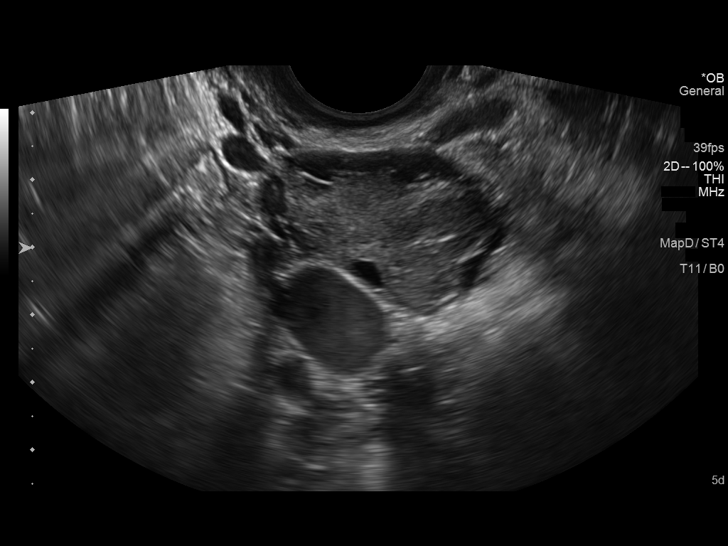
[im 33/58]
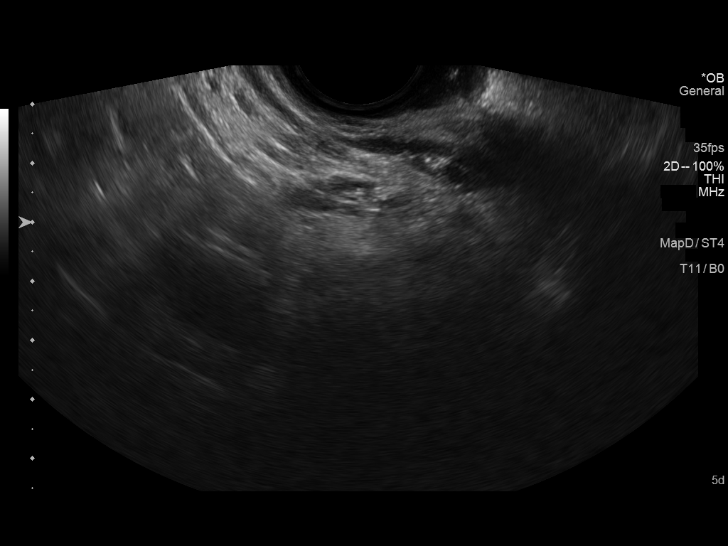
[im 38/58]
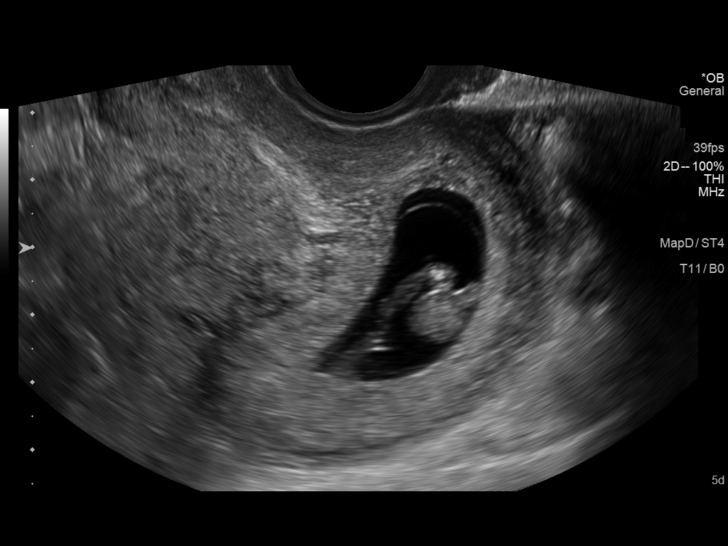
[im 42/58]
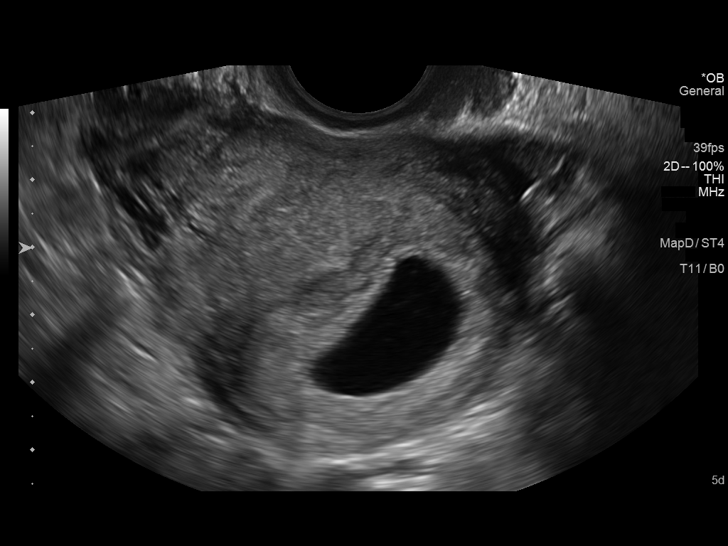
[im 47/58]
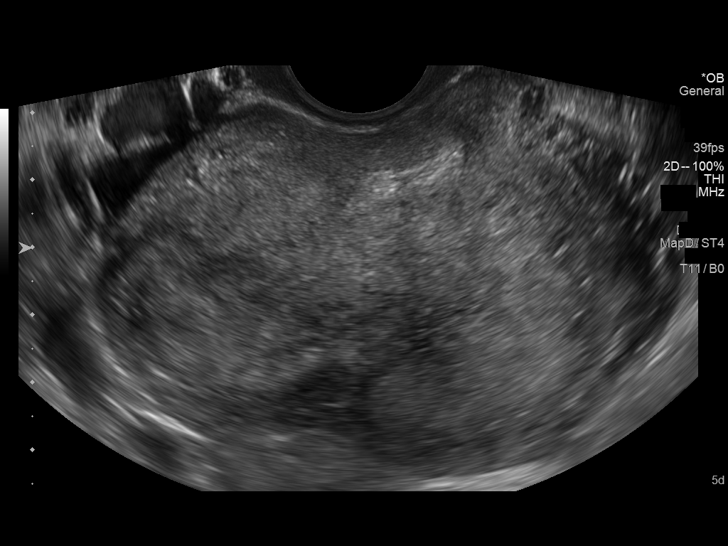
[im 51/58]
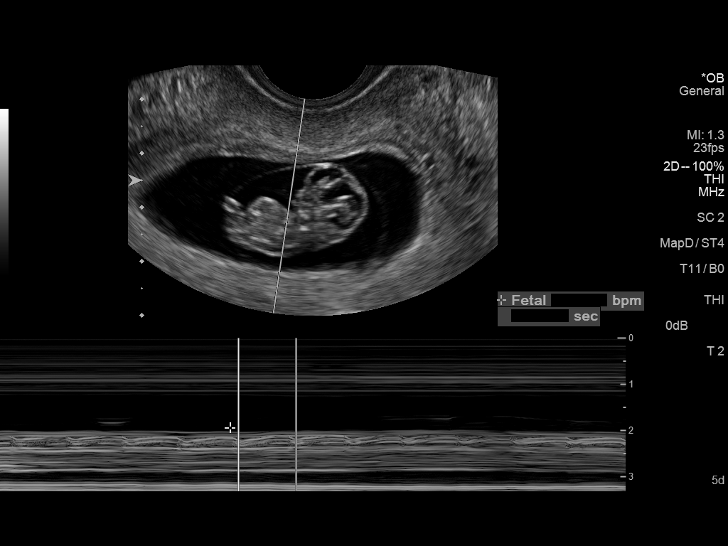
[im 55/58]
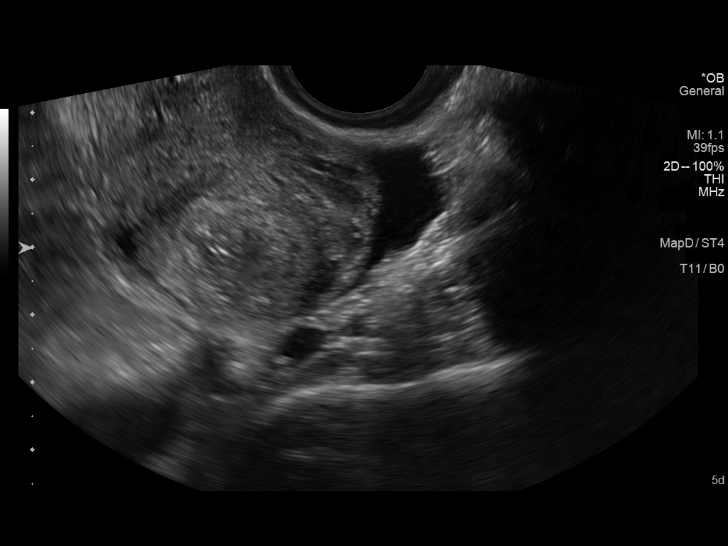

[Series 3: us ob transvaginal · 0.11mm/px · 1 of 2 slices shown (2 of 2)]
[im 1/2]
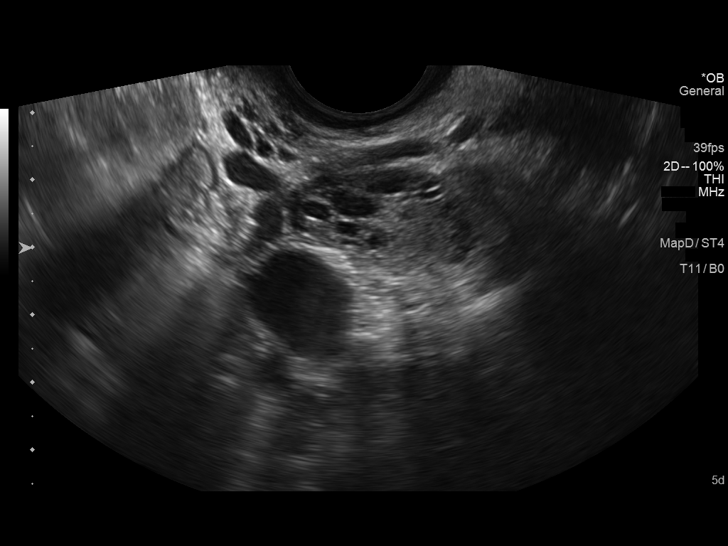

[14 of 28 positions shown; findings below may reference images not displayed]

FINDINGS: Intrauterine gestational sac: Single

Yolk sac:  Visualized.

Embryo:  Visualized.

Cardiac Activity: Visualized.

Heart Rate: 178 bpm

CRL:  26 mm   9 w   2 d                  US EDC: 08/01/2018

Subchorionic hemorrhage:  None visualized.

Maternal uterus/adnexae: Retroflexed uterus. Normal appearance of
both ovaries. No mass or abnormal free fluid identified.
IMPRESSION: Single living IUP measuring 9 weeks 2 days, with US EDC of
08/01/2018.

No significant maternal uterine or adnexal abnormality identified.

## 2019-07-31 ENCOUNTER — Other Ambulatory Visit: Payer: Self-pay | Admitting: Certified Nurse Midwife

## 2019-08-19 ENCOUNTER — Encounter: Payer: Self-pay | Admitting: *Deleted

## 2019-09-19 ENCOUNTER — Other Ambulatory Visit: Payer: Self-pay | Admitting: Certified Nurse Midwife

## 2019-10-05 ENCOUNTER — Other Ambulatory Visit: Payer: Self-pay | Admitting: Certified Nurse Midwife

## 2020-01-20 ENCOUNTER — Encounter (HOSPITAL_BASED_OUTPATIENT_CLINIC_OR_DEPARTMENT_OTHER): Payer: Self-pay | Admitting: Emergency Medicine

## 2020-01-20 ENCOUNTER — Other Ambulatory Visit: Payer: Self-pay

## 2020-01-20 ENCOUNTER — Emergency Department (HOSPITAL_BASED_OUTPATIENT_CLINIC_OR_DEPARTMENT_OTHER)
Admission: EM | Admit: 2020-01-20 | Discharge: 2020-01-20 | Disposition: A | Discharge status: Home / Self Care | Payer: BC Managed Care – PPO | Attending: Emergency Medicine | Admitting: Emergency Medicine

## 2020-01-20 DIAGNOSIS — R55 Syncope and collapse: Secondary | ICD-10-CM | POA: Insufficient documentation

## 2020-01-20 DIAGNOSIS — Z79899 Other long term (current) drug therapy: Secondary | ICD-10-CM | POA: Diagnosis not present

## 2020-01-20 DIAGNOSIS — R42 Dizziness and giddiness: Secondary | ICD-10-CM | POA: Diagnosis present

## 2020-01-20 LAB — CBC WITH DIFFERENTIAL/PLATELET
Abs Immature Granulocytes: 0.03 10*3/uL (ref 0.00–0.07)
Basophils Absolute: 0 10*3/uL (ref 0.0–0.1)
Basophils Relative: 0 %
Eosinophils Absolute: 0 10*3/uL (ref 0.0–0.5)
Eosinophils Relative: 0 %
HCT: 41.6 % (ref 36.0–46.0)
Hemoglobin: 13.7 g/dL (ref 12.0–15.0)
Immature Granulocytes: 0 %
Lymphocytes Relative: 18 %
Lymphs Abs: 1.4 10*3/uL (ref 0.7–4.0)
MCH: 29.8 pg (ref 26.0–34.0)
MCHC: 32.9 g/dL (ref 30.0–36.0)
MCV: 90.6 fL (ref 80.0–100.0)
Monocytes Absolute: 0.2 10*3/uL (ref 0.1–1.0)
Monocytes Relative: 3 %
Neutro Abs: 6.2 10*3/uL (ref 1.7–7.7)
Neutrophils Relative %: 79 %
Platelets: 172 10*3/uL (ref 150–400)
RBC: 4.59 MIL/uL (ref 3.87–5.11)
RDW: 12.7 % (ref 11.5–15.5)
WBC: 7.9 10*3/uL (ref 4.0–10.5)
nRBC: 0 % (ref 0.0–0.2)

## 2020-01-20 LAB — BASIC METABOLIC PANEL
Anion gap: 7 (ref 5–15)
BUN: 12 mg/dL (ref 6–20)
CO2: 26 mmol/L (ref 22–32)
Calcium: 9.4 mg/dL (ref 8.9–10.3)
Chloride: 106 mmol/L (ref 98–111)
Creatinine, Ser: 0.87 mg/dL (ref 0.44–1.00)
GFR calc Af Amer: >60 mL/min (ref >60)
GFR calc non Af Amer: >60 mL/min (ref >60)
Glucose, Bld: 108 mg/dL — ABNORMAL HIGH (ref 70–99)
Potassium: 4.3 mmol/L (ref 3.5–5.1)
Sodium: 139 mmol/L (ref 135–145)

## 2020-01-20 LAB — URINALYSIS, ROUTINE W REFLEX MICROSCOPIC
Bilirubin Urine: NEGATIVE
Glucose, UA: NEGATIVE mg/dL
Hgb urine dipstick: NEGATIVE
Ketones, ur: NEGATIVE mg/dL
Leukocytes,Ua: NEGATIVE
Nitrite: NEGATIVE
Protein, ur: NEGATIVE mg/dL
Specific Gravity, Urine: 1.015 (ref 1.005–1.030)
pH: 7.5 (ref 5.0–8.0)

## 2020-01-20 LAB — PREGNANCY, URINE: Preg Test, Ur: NEGATIVE

## 2020-01-20 MED ORDER — DIAZEPAM 5 MG PO TABS
5.0000 mg | ORAL_TABLET | Freq: Once | ORAL | Status: DC
  Filled 2020-01-20: qty 1

## 2020-01-20 MED ORDER — SODIUM CHLORIDE 0.9 % IV BOLUS
1000.0000 mL | Freq: Once | INTRAVENOUS | Status: AC
  Administered 2020-01-20: 1000 mL via INTRAVENOUS

## 2020-01-20 MED ORDER — KETOROLAC TROMETHAMINE 15 MG/ML IJ SOLN
15.0000 mg | Freq: Once | INTRAMUSCULAR | Status: AC
  Administered 2020-01-20: 15 mg via INTRAVENOUS
  Filled 2020-01-20: qty 1

## 2020-01-20 NOTE — ED Triage Notes (Signed)
Pt reports dizziness/feeling like passing out/headache since last night. Pt ambulatory at a steady gait. Symptoms worse with position changes.

## 2020-01-20 NOTE — ED Provider Notes (Signed)
[facility] EMERGENCY DEPARTMENT Provider Note   CSN: [id number] Arrival date & time: 01/20/20  0016     History Chief Complaint  Patient presents with  . Headache    positional dizziness    Angelica Dickerson is a 34 y.o. female.  HPI     This is a 34 year old female who presents with dizziness.  Patient reports that she was feeling well until after dinner when she had onset of lightheadedness and dizziness.  She denies room spinning dizziness.  She describes palpitations when lying flat and worsening feelings of lightheadedness.  She states earlier in the day she had noted a dull light right-sided posterior headache.  She states she has had 1 migraine but does not normally get headaches.  She states her headache is currently 2 out of 10.  She denies any chest pain or shortness of breath.  Symptoms are very positional.  Denies any risk factors for dehydration.  No significant exposure to heat.  Denies weakness, numbness, strokelike symptoms.  No recent illnesses or upper respiratory symptoms.  Past Medical History:  Diagnosis Date  . History of shingles     There are no problems to display for this patient.   Past Surgical History:  Procedure Laterality Date  . WISDOM TOOTH EXTRACTION  2009     OB History    Gravida  2   Para  1   Term  1   Preterm  0   AB  0   Living  1     SAB  0   TAB  0   Ectopic  0   Multiple  0   Live Births  1           Family History  Problem Relation Age of Onset  . Hyperlipidemia Father   . Diabetes Paternal Grandfather   . Hyperlipidemia Paternal Grandfather     Social History   Tobacco Use  . Smoking status: Never Smoker  . Smokeless tobacco: Never Used  Vaping Use  . Vaping Use: Never used  Substance Use Topics  . Alcohol use: Yes    Alcohol/week: 0.0 standard drinks    Comment: Not during pregnancy  . Drug use: No    Home Medications Prior to Admission medications   Medication Sig  Start Date End Date Taking? Authorizing Provider  ERRIN 0.35 MG tablet TAKE 1 TABLET BY MOUTH  DAILY 08/01/19   Donette Larry, CNM  ibuprofen (ADVIL,MOTRIN) 600 MG tablet Take 1 tablet (600 mg total) by mouth every 6 (six) hours. 07/30/18   Tamera Stands, DO  Lactobacillus (PROBIOTIC ACIDOPHILUS PO) Take 1 Can by mouth daily.     [provider]  Prenatal Vit-Fe Fumarate-FA (PRENATAL MULTIVITAMIN) TABS tablet Take 1 tablet by mouth daily at 12 noon.    [provider]    Allergies    Amoxicillin  Review of Systems   Review of Systems  Constitutional: Negative for fever.  Respiratory: Negative for shortness of breath.   Cardiovascular: Negative for chest pain.  Gastrointestinal: Positive for nausea. Negative for abdominal pain and vomiting.  Genitourinary: Negative for dysuria.  Neurological: Positive for light-headedness and headaches. Negative for dizziness, speech difficulty and weakness.  All other systems reviewed and are negative.   Physical Exam Updated Vital Signs BP 116/73   Pulse 73   Temp 98.8 F (37.1 C) (Oral)   Resp 20   LMP 01/13/2020   SpO2 100%   Physical Exam Vitals and  nursing note reviewed.  Constitutional:      Appearance: She is well-developed. She is not ill-appearing.  HENT:     Head: Normocephalic and atraumatic.     Mouth/Throat:     Mouth: Mucous membranes are moist.  Eyes:     Pupils: Pupils are equal, round, and reactive to light.  Neck:     Comments: No meningismus, no tenderness palpation along the occiput Cardiovascular:     Rate and Rhythm: Normal rate and regular rhythm.     Heart sounds: Normal heart sounds.  Pulmonary:     Effort: Pulmonary effort is normal. No respiratory distress.     Breath sounds: No wheezing.  Abdominal:     General: Bowel sounds are normal.     Palpations: Abdomen is soft.  Musculoskeletal:     Cervical back: Normal range of motion and neck supple.  Skin:    General: Skin is warm  and dry.  Neurological:     Mental Status: She is alert and oriented to person, place, and time.     Comments: Cranial nerves II through XII intact, 5 out of 5 strength in all 4 extremities, no dysmetria to finger-nose-finger  Psychiatric:        Mood and Affect: Mood normal.     ED Results / Procedures / Treatments   Labs (all labs ordered are listed, but only abnormal results are displayed) Labs Reviewed  BASIC METABOLIC PANEL - Abnormal; Notable for the following components:      Result Value   Glucose, Bld 108 (*)    All other components within normal limits  CBC WITH DIFFERENTIAL/PLATELET  URINALYSIS, ROUTINE W REFLEX MICROSCOPIC  PREGNANCY, URINE    EKG EKG Interpretation  Date/Time:  Monday January 20 2020 02:27:43 EDT Ventricular Rate:  102 PR Interval:    QRS Duration: 86 QT Interval:  341 QTC Calculation: 445 R Axis:   80 Text Interpretation: Sinus tachycardia Right atrial enlargement Borderline T wave abnormalities Confirmed by Ross Marcus ([id number]) on 01/20/2020 3:13:23 AM   Radiology No results found.  Procedures Procedures (including critical care time)  Medications Ordered in ED Medications  diazepam (VALIUM) tablet 5 mg (5 mg Oral Refused 01/20/20 0256)  sodium chloride 0.9 % bolus 1,000 mL (0 mLs Intravenous Stopped 01/20/20 0402)  ketorolac (TORADOL) 15 MG/ML injection 15 mg (15 mg Intravenous Given 01/20/20 0240)    ED Course  I have reviewed the triage vital signs and the nursing notes.  Pertinent labs & imaging results that were available during my care of the patient were reviewed by me and considered in my medical decision making (see chart for details).    MDM Rules/Calculators/A&P                           Patient presents with near syncopal episode.  Also reports dull headache from earlier today.  She is overall nontoxic and vital signs are reassuring.  She is neurologically intact.  She is not orthostatic but does increase her heart  rate by 10 upon standing which could represent some mild dehydration.  EKG does not show any evidence of acute arrhythmia or ischemia.  EKG taken after standing with a heart rate of 102.  Patient without shortness of breath or chest pain.  Doubt PE.  Patient does not describe dizziness consistent with vertigo.  Lab work obtained.  No significant metabolic derangements.  Urinalysis reassuring.  Patient feels better after fluids.  No residual headache and she is ambulatory independently.  Low risk near syncope.  Do not feel she needs further work-up at this time.  Recommend hydration and close follow-up with her primary physician.  After history, exam, and medical workup I feel the patient has been appropriately medically screened and is safe for discharge home. Pertinent diagnoses were discussed with the patient. Patient was given return precautions.   Final Clinical Impression(s) / ED Diagnoses Final diagnoses:  Postural dizziness with near syncope    Rx / DC Orders ED Discharge Orders    None       Altheia Shafran, Mayer Masker, MD 01/20/20 [phone]

## 2020-01-20 NOTE — Discharge Instructions (Addendum)
Seen today for dizziness and headache.  Your basic work-up is reassuring.  You did have a slight elevation in your heart rate upon standing.  This may represent some mild dehydration.  Make sure to stay hydrated.  Follow-up closely with your primary physician.

## 2020-01-27 DIAGNOSIS — F410 Panic disorder [episodic paroxysmal anxiety] without agoraphobia: Secondary | ICD-10-CM | POA: Insufficient documentation

## 2020-03-29 NOTE — Progress Notes (Signed)
New Patient Office Visit  Subjective:  Patient ID: Angelica Dickerson, female    DOB: 12-07-1985  Age: 34 y.o. MRN: [id number]  CC:  Chief Complaint  Patient presents with  . Establish Care    HPI Angelica Dickerson presents to establish care.  Anxiety- went to the ED at [facility] in July after having an episode of dizziness with left sided headache, left facial tingling, increased heart rate, and feelings of almost passing out. At that time, they could not find an explanation for her symptoms and thought she was dehydrated. A couple of days later she again had similar symptoms while driving. Since then she has had intermittent episodes usually occurring 1.5-2 weeks after her menses. Endorses poor vision focus during these spells. She is seeing a therapist once every 2 weeks which she reports is helpful.   Hair loss- about 4 months ago, she noticed an increase in hair loss as well as dry skin. No weight changes.   Past Medical History:  Diagnosis Date  . History of shingles     Past Surgical History:  Procedure Laterality Date  . WISDOM TOOTH EXTRACTION  2009    Family History  Problem Relation Age of Onset  . Hyperlipidemia Father   . Hypertension Father   . Diabetes Paternal Grandfather   . Hyperlipidemia Paternal Grandfather   . Diabetes Maternal Grandfather     Social History   Socioeconomic History  . Marital status: Married    Spouse name: Not on file  . Number of children: Not on file  . Years of education: Not on file  . Highest education level: Not on file  Occupational History  . Occupation: Environmental manager  Tobacco Use  . Smoking status: Never Smoker  . Smokeless tobacco: Never Used  Vaping Use  . Vaping Use: Never used  Substance and Sexual Activity  . Alcohol use: Yes    Alcohol/week: 0.0 standard drinks    Comment: Occasionally  . Drug use: Never  . Sexual activity: Yes    Partners: Male    Birth control/protection: None     Comment: Husband vasectomy  Other Topics Concern  . Not on file  Social History Narrative  . Not on file   Social Determinants of Health   Financial Resource Strain:   . Difficulty of Paying Living Expenses: Not on file  Food Insecurity:   . Worried About Programme researcher, broadcasting/film/video in the Last Year: Not on file  . Ran Out of Food in the Last Year: Not on file  Transportation Needs:   . Lack of Transportation (Medical): Not on file  . Lack of Transportation (Non-Medical): Not on file  Physical Activity:   . Days of Exercise per Week: Not on file  . Minutes of Exercise per Session: Not on file  Stress:   . Feeling of Stress : Not on file  Social Connections:   . Frequency of Communication with Friends and Family: Not on file  . Frequency of Social Gatherings with Friends and Family: Not on file  . Attends Religious Services: Not on file  . Active Member of Clubs or Organizations: Not on file  . Attends Banker Meetings: Not on file  . Marital Status: Not on file  Intimate Partner Violence:   . Fear of Current or Ex-Partner: Not on file  . Emotionally Abused: Not on file  . Physically Abused: Not on file  . Sexually Abused: Not on file  ROS Review of Systems  Constitutional: Positive for fatigue. Negative for chills, fever and unexpected weight change.  HENT: Positive for congestion (back of the throat).        Ear pressure, feels clogged bilaterally.  Respiratory: Negative for cough, chest tightness, shortness of breath and wheezing.   Cardiovascular: Positive for palpitations (during anxiety attacks). Negative for chest pain and leg swelling.  Gastrointestinal: Negative for abdominal pain, constipation, diarrhea, nausea and vomiting.  Genitourinary: Negative for dysuria, frequency and urgency.  Musculoskeletal: Positive for neck pain.  Skin:       Dry skin  Allergic/Immunologic: Positive for environmental allergies.  Neurological: Positive for headaches  (once or twice a month, cyclicla). Negative for dizziness, weakness and light-headedness.  Hematological: Does not bruise/bleed easily.  Psychiatric/Behavioral: Negative for dysphoric mood, self-injury, sleep disturbance and suicidal ideas. The patient is nervous/anxious (panic attacks about once a month).     Objective:   Today's Vitals: BP 123/76   Pulse 80   Temp 98.1 F (36.7 C) (Oral)   Ht 5' 5.5" (1.664 m)   Wt 121 lb 6.4 oz (55.1 kg)   LMP 03/16/2020   SpO2 100%   BMI 19.89 kg/m   Physical Exam Vitals reviewed.  Constitutional:      General: She is not in acute distress.    Appearance: Normal appearance.  HENT:     Head: Normocephalic and atraumatic.     Right Ear: Tympanic membrane, ear canal and external ear normal. There is no impacted cerumen.     Left Ear: Tympanic membrane, ear canal and external ear normal. There is no impacted cerumen.  Cardiovascular:     Rate and Rhythm: Normal rate and regular rhythm.     Pulses: Normal pulses.     Heart sounds: Normal heart sounds. No murmur heard.  No friction rub. No gallop.   Pulmonary:     Effort: Pulmonary effort is normal. No respiratory distress.     Breath sounds: Normal breath sounds. No wheezing.  Musculoskeletal:     Cervical back: Neck supple. No tenderness.  Lymphadenopathy:     Cervical: No cervical adenopathy.  Skin:    General: Skin is warm and dry.  Neurological:     Mental Status: She is alert and oriented to person, place, and time.  Psychiatric:        Mood and Affect: Mood normal.        Behavior: Behavior normal.        Thought Content: Thought content normal.        Judgment: Judgment normal.    Assessment & Plan:   1. Encounter to establish care Reviewed available information and discussed health care concerns with patient. Due for annual physical exam. Up to date on pap smear.   2. Panic attacks From timing of episodes about 1/5-2 weeks after menses, appears to be related to her  menstrual cycle. Discussed treatment options. She would like to defer starting medications and plans to continue therapy once every two weeks.  3. Preventative health care No preventative care provided today. Labs entered for upcoming physical so she can get all blood work during one visit to the lab.  - CBC - COMPLETE METABOLIC PANEL WITH GFR - Lipid panel  4. Hair loss With her increase in anxiety, hair loss, and complaints of dry skin, checking TSH.  - TSH  5. Dysfunction of both eustachian tubes Recommend daily antihistamine, Xyzal or Allegra since Claritin was not helpful and Zyrtec caused mental  fogginess. Also recommend using Flonase (or Nasacort/rhinocort) daily to help reduce inflammation and "clogged ear" feeling.   6. Need for hepatitis C screening test Discussed screening recommendations. Patient agreeable so adding to blood work.  - Hepatitis C antibody  7. Need for influenza vaccination Flu vaccine given in office today.  - Flu Vaccine QUAD 36+ mos IM   Outpatient Encounter Medications as of 03/30/2020  Medication Sig  . Lactobacillus (PROBIOTIC ACIDOPHILUS PO) Take 1 Can by mouth daily.   . Multiple Vitamin tablet Take 1 tablet by mouth daily.  . [DISCONTINUED] ERRIN 0.35 MG tablet TAKE 1 TABLET BY MOUTH  DAILY  . [DISCONTINUED] ibuprofen (ADVIL,MOTRIN) 600 MG tablet Take 1 tablet (600 mg total) by mouth every 6 (six) hours.  . [DISCONTINUED] Prenatal Vit-Fe Fumarate-FA (PRENATAL MULTIVITAMIN) TABS tablet Take 1 tablet by mouth daily at 12 noon. (Patient not taking: Reported on 03/30/2020)   No facility-administered encounter medications on file as of 03/30/2020.    Follow-up: Return for annual physical exam at your convenience.   Thayer Ohm, DNP, APRN, FNP-BC [facility]

## 2020-03-30 ENCOUNTER — Ambulatory Visit (INDEPENDENT_AMBULATORY_CARE_PROVIDER_SITE_OTHER): Payer: BC Managed Care – PPO | Admitting: Medical-Surgical

## 2020-03-30 ENCOUNTER — Encounter: Payer: Self-pay | Admitting: Medical-Surgical

## 2020-03-30 ENCOUNTER — Other Ambulatory Visit: Payer: Self-pay

## 2020-03-30 VITALS — BP 123/76 | HR 80 | Temp 98.1°F | Ht 65.5 in | Wt 121.4 lb

## 2020-03-30 DIAGNOSIS — Z23 Encounter for immunization: Secondary | ICD-10-CM | POA: Diagnosis not present

## 2020-03-30 DIAGNOSIS — Z0000 Encounter for general adult medical examination without abnormal findings: Secondary | ICD-10-CM

## 2020-03-30 DIAGNOSIS — Z1159 Encounter for screening for other viral diseases: Secondary | ICD-10-CM | POA: Diagnosis not present

## 2020-03-30 DIAGNOSIS — H6983 Other specified disorders of Eustachian tube, bilateral: Secondary | ICD-10-CM | POA: Diagnosis not present

## 2020-03-30 DIAGNOSIS — F410 Panic disorder [episodic paroxysmal anxiety] without agoraphobia: Secondary | ICD-10-CM | POA: Diagnosis not present

## 2020-03-30 DIAGNOSIS — Z7689 Persons encountering health services in other specified circumstances: Secondary | ICD-10-CM

## 2020-03-30 DIAGNOSIS — L659 Nonscarring hair loss, unspecified: Secondary | ICD-10-CM | POA: Diagnosis not present

## 2020-03-30 NOTE — Patient Instructions (Signed)

## 2020-04-07 LAB — CBC
HCT: 40.1 % (ref 35.0–45.0)
Hemoglobin: 13.4 g/dL (ref 11.7–15.5)
MCH: 30.5 pg (ref 27.0–33.0)
MCHC: 33.4 g/dL (ref 32.0–36.0)
MCV: 91.1 fL (ref 80.0–100.0)
MPV: 10.8 fL (ref 7.5–12.5)
Platelets: 170 10*3/uL (ref 140–400)
RBC: 4.4 10*6/uL (ref 3.80–5.10)
RDW: 12.3 % (ref 11.0–15.0)
WBC: 6 10*3/uL (ref 3.8–10.8)

## 2020-04-07 LAB — LIPID PANEL
Cholesterol: 164 mg/dL (ref <200)
HDL: 57 mg/dL (ref >=50)
LDL Cholesterol (Calc): 91 mg/dL (calc)
Non-HDL Cholesterol (Calc): 107 mg/dL (calc) (ref <130)
Total CHOL/HDL Ratio: 2.9 (calc) (ref <5.0)
Triglycerides: 75 mg/dL (ref <150)

## 2020-04-07 LAB — COMPLETE METABOLIC PANEL WITH GFR
AG Ratio: 1.8 (calc) (ref 1.0–2.5)
ALT: 11 U/L (ref 6–29)
AST: 11 U/L (ref 10–30)
Albumin: 4.4 g/dL (ref 3.6–5.1)
Alkaline phosphatase (APISO): 55 U/L (ref 31–125)
BUN: 16 mg/dL (ref 7–25)
CO2: 26 mmol/L (ref 20–32)
Calcium: 9.4 mg/dL (ref 8.6–10.2)
Chloride: 105 mmol/L (ref 98–110)
Creat: 0.87 mg/dL (ref 0.50–1.10)
GFR, Est African American: 101 mL/min/{1.73_m2} (ref >=60)
GFR, Est Non African American: 87 mL/min/{1.73_m2} (ref >=60)
Globulin: 2.4 g/dL (calc) (ref 1.9–3.7)
Glucose, Bld: 88 mg/dL (ref 65–99)
Potassium: 4.6 mmol/L (ref 3.5–5.3)
Sodium: 139 mmol/L (ref 135–146)
Total Bilirubin: 0.5 mg/dL (ref 0.2–1.2)
Total Protein: 6.8 g/dL (ref 6.1–8.1)

## 2020-04-07 LAB — TSH: TSH: 1.5 mIU/L

## 2020-04-07 LAB — HEPATITIS C ANTIBODY
Hepatitis C Ab: NONREACTIVE
SIGNAL TO CUT-OFF: 0.01 (ref <1.00)

## 2020-08-21 ENCOUNTER — Ambulatory Visit: Payer: BC Managed Care – PPO | Admitting: Medical-Surgical

## 2020-08-21 ENCOUNTER — Encounter: Payer: Self-pay | Admitting: Medical-Surgical

## 2020-08-21 ENCOUNTER — Other Ambulatory Visit: Payer: Self-pay

## 2020-08-21 ENCOUNTER — Other Ambulatory Visit (HOSPITAL_COMMUNITY)
Admission: RE | Admit: 2020-08-21 | Discharge: 2020-08-21 | Disposition: A | Discharge status: Home / Self Care | Payer: BC Managed Care – PPO | Source: Ambulatory Visit | Attending: Medical-Surgical | Admitting: Medical-Surgical

## 2020-08-21 VITALS — BP 115/77 | HR 74 | Temp 97.8°F | Wt 120.4 lb

## 2020-08-21 DIAGNOSIS — Z0000 Encounter for general adult medical examination without abnormal findings: Secondary | ICD-10-CM | POA: Insufficient documentation

## 2020-08-21 DIAGNOSIS — Z124 Encounter for screening for malignant neoplasm of cervix: Secondary | ICD-10-CM

## 2020-08-21 MED ORDER — MOMETASONE FUROATE 50 MCG/ACT NA SUSP: NASAL | 6 refills | Status: DC

## 2020-08-21 MED ORDER — MONTELUKAST SODIUM 10 MG PO TABS
10.0000 mg | ORAL_TABLET | Freq: Every day | ORAL | 3 refills | Status: DC
Start: 2020-08-21 — End: 2021-08-04

## 2020-08-21 NOTE — Patient Instructions (Signed)
Preventive Care 21-35 Years Old, Female Preventive care refers to lifestyle choices and visits with your health care provider that can promote health and wellness. This includes:  A yearly physical exam. This is also called an annual wellness visit.  Regular dental and eye exams.  Immunizations.  Screening for certain conditions.  Healthy lifestyle choices, such as: ? Eating a healthy diet. ? Getting regular exercise. ? Not using drugs or products that contain nicotine and tobacco. ? Limiting alcohol use. What can I expect for my preventive care visit? Physical exam Your health care provider may check your:  Height and weight. These may be used to calculate your BMI (body mass index). BMI is a measurement that tells if you are at a healthy weight.  Heart rate and blood pressure.  Body temperature.  Skin for abnormal spots. Counseling Your health care provider may ask you questions about your:  Past medical problems.  Family's medical history.  Alcohol, tobacco, and drug use.  Emotional well-being.  Home life and relationship well-being.  Sexual activity.  Diet, exercise, and sleep habits.  Work and work environment.  Access to firearms.  Method of birth control.  Menstrual cycle.  Pregnancy history. What immunizations do I need? Vaccines are usually given at various ages, according to a schedule. Your health care provider will recommend vaccines for you based on your age, medical history, and lifestyle or other factors, such as travel or where you work.   What tests do I need? Blood tests  Lipid and cholesterol levels. These may be checked every 5 years starting at age 20.  Hepatitis C test.  Hepatitis B test. Screening  Diabetes screening. This is done by checking your blood sugar (glucose) after you have not eaten for a while (fasting).  STD (sexually transmitted disease) testing, if you are at risk.  BRCA-related cancer screening. This may be  done if you have a family history of breast, ovarian, tubal, or peritoneal cancers.  Pelvic exam and Pap test. This may be done every 3 years starting at age 21. Starting at age 30, this may be done every 5 years if you have a Pap test in combination with an HPV test. Talk with your health care provider about your test results, treatment options, and if necessary, the need for more tests.   Follow these instructions at home: Eating and drinking  Eat a healthy diet that includes fresh fruits and vegetables, whole grains, lean protein, and low-fat dairy products.  Take vitamin and mineral supplements as recommended by your health care provider.  Do not drink alcohol if: ? Your health care provider tells you not to drink. ? You are pregnant, may be pregnant, or are planning to become pregnant.  If you drink alcohol: ? Limit how much you have to 0-1 drink a day. ? Be aware of how much alcohol is in your drink. In the [place] one drink equals one 12 oz bottle of beer (355 mL), one 5 oz glass of wine (148 mL), or one 1 oz glass of hard liquor (44 mL).   Lifestyle  Take daily care of your teeth and gums. Brush your teeth every morning and night with fluoride toothpaste. Floss one time each day.  Stay active. Exercise for at least 30 minutes 5 or more days each week.  Do not use any products that contain nicotine or tobacco, such as cigarettes, e-cigarettes, and chewing tobacco. If you need help quitting, ask your health care provider.  Do not   use drugs.  If you are sexually active, practice safe sex. Use a condom or other form of protection to prevent STIs (sexually transmitted infections).  If you do not wish to become pregnant, use a form of birth control. If you plan to become pregnant, see your health care provider for a prepregnancy visit.  Find healthy ways to cope with stress, such as: ? Meditation, yoga, or listening to music. ? Journaling. ? Talking to a trusted  person. ? Spending time with friends and family. Safety  Always wear your seat belt while driving or riding in a vehicle.  Do not drive: ? If you have been drinking alcohol. Do not ride with someone who has been drinking. ? When you are tired or distracted. ? While texting.  Wear a helmet and other protective equipment during sports activities.  If you have firearms in your house, make sure you follow all gun safety procedures.  Seek help if you have been physically or sexually abused. What's next?  Go to your health care provider once a year for an annual wellness visit.  Ask your health care provider how often you should have your eyes and teeth checked.  Stay up to date on all vaccines. This information is not intended to replace advice given to you by your health care provider. Make sure you discuss any questions you have with your health care provider. Document Revised: 02/23/2020 Document Reviewed: 03/08/2018 Elsevier Patient Education  2021 [employer]  

## 2020-08-21 NOTE — Progress Notes (Signed)
HPI: Angelica Dickerson is a 35 y.o. female who  has a past medical history of History of shingles.  she presents to [facility] today, 08/21/20,  for chief complaint of: Annual physical exam  Dentist: due for visit, on the to do list, has some jaw clicking Eye exam: last year, wears glasses prn Exercise: tries to stay active on a regular basis Diet: fairly decent diet, some foods trigger anxiety Pap smear: done today COVID vaccine: UTD, no booster yet  Concerns:  Ears staying clogged up  Past medical, surgical, social and family history reviewed:  Patient Active Problem List   Diagnosis Date Noted  . Hair loss 03/30/2020  . Panic attacks 01/27/2020    Past Surgical History:  Procedure Laterality Date  . WISDOM TOOTH EXTRACTION  2009    Social History   Tobacco Use  . Smoking status: Never Smoker  . Smokeless tobacco: Never Used  Substance Use Topics  . Alcohol use: Yes    Alcohol/week: 0.0 standard drinks    Comment: Occasionally    Family History  Problem Relation Age of Onset  . Hyperlipidemia Father   . Hypertension Father   . Diabetes Paternal Grandfather   . Hyperlipidemia Paternal Grandfather   . Diabetes Maternal Grandfather      Current medication list and allergy/intolerance information reviewed:    Current Outpatient Medications  Medication Sig Dispense Refill  . mometasone (NASONEX) 50 MCG/ACT nasal spray One spray in each nostril twice a day, use left hand for right nostril, and right hand for left nostril.  Please dispense one bottle. 1 g 6  . montelukast (SINGULAIR) 10 MG tablet Take 1 tablet (10 mg total) by mouth at bedtime. 30 tablet 3  . Lactobacillus (PROBIOTIC ACIDOPHILUS PO) Take 1 Can by mouth daily.     . Multiple Vitamin tablet Take 1 tablet by mouth daily.     No current facility-administered medications for this visit.    Allergies  Allergen Reactions  . Amoxicillin Hives and Itching       Review of Systems:  Constitutional:  No  fever, no chills, No recent illness, No unintentional weight changes. No significant fatigue.   HEENT: No  headache, no vision change, no hearing change, No sore throat, No  sinus pressure  Cardiac: No  chest pain, No  pressure, No palpitations, No  Orthopnea  Respiratory:  No  shortness of breath. No  Cough  Gastrointestinal: No  abdominal pain, No  nausea, No  vomiting,  No  blood in stool, No  diarrhea, No  constipation   Musculoskeletal: No new myalgia/arthralgia  Skin: No  Rash, No other wounds/concerning lesions  Genitourinary: No  incontinence, No  abnormal genital bleeding, No abnormal genital discharge  Hem/Onc: No  easy bruising/bleeding, No  abnormal lymph node  Endocrine: No cold intolerance,  No heat intolerance. No polyuria/polydipsia/polyphagia   Neurologic: No  weakness, No  dizziness, No  slurred speech/focal weakness/facial droop  Psychiatric: No  concerns with depression, +  concerns with anxiety, No sleep problems, No mood problems  Exam:  BP 115/77   Pulse 74   Temp 97.8 F (36.6 C)   Wt 120 lb 6.4 oz (54.6 kg)   SpO2 94%   BMI 19.73 kg/m   Constitutional: VS see above. General Appearance: alert, well-developed, well-nourished, NAD  Eyes: Normal lids and conjunctive, non-icteric sclera  Ears, Nose, Mouth, Throat: MMM, Normal external inspection ears/nares/mouth/lips/gums. TM normal bilaterally.   Neck: No  masses, trachea midline. No thyroid enlargement. No tenderness/mass appreciated. No lymphadenopathy  Respiratory: Normal respiratory effort. no wheeze, no rhonchi, no rales  Cardiovascular: S1/S2 normal, no murmur, no rub/gallop auscultated. RRR. No lower extremity edema. . No carotid bruit or JVD. No abdominal aortic bruit.  Gastrointestinal: Nontender, no masses. No hepatomegaly, no splenomegaly. No hernia appreciated. Bowel sounds normal. Rectal exam deferred.   Musculoskeletal: Gait normal.  No clubbing/cyanosis of digits.   Neurological: Normal balance/coordination. No tremor. No cranial nerve deficit on limited exam. Motor and sensation intact and symmetric. Cerebellar reflexes intact.   Skin: warm, dry, intact. No rash/ulcer. No concerning nevi or subq nodules on limited exam.    Psychiatric: Normal judgment/insight. Normal mood and affect. Oriented x3.    No results found for this or any previous visit (from the past 72 hour(s)).  No results found.   ASSESSMENT/PLAN:   1. Annual physical exam Labs checked for preventive care in 03/2020. Reviewed results.  - Cytology - PAP  2. Cervical cancer screening Pap smear with HPV co-testing completed.  3. Eustachian tube dysfunction Since she has failed OTC antihistamines, will trial Nasonex along with Singulair.   No orders of the defined types were placed in this encounter.   Meds ordered this encounter  Medications  . mometasone (NASONEX) 50 MCG/ACT nasal spray    Sig: One spray in each nostril twice a day, use left hand for right nostril, and right hand for left nostril.  Please dispense one bottle.    Dispense:  1 g    Refill:  6    Order Specific Question:   Supervising Provider    Answer:   Emeterio Reeve [id number]  . montelukast (SINGULAIR) 10 MG tablet    Sig: Take 1 tablet (10 mg total) by mouth at bedtime.    Dispense:  30 tablet    Refill:  3    Order Specific Question:   Supervising Provider    Answer:   Emeterio Reeve [[id number]]    Patient Instructions   Preventive Care 24-39 Years Old, Female Preventive care refers to lifestyle choices and visits with your health care provider that can promote health and wellness. This includes:  A yearly physical exam. This is also called an annual wellness visit.  Regular dental and eye exams.  Immunizations.  Screening for certain conditions.  Healthy lifestyle choices, such as: ? Eating a healthy diet. ? Getting regular exercise. ? Not using  drugs or products that contain nicotine and tobacco. ? Limiting alcohol use. What can I expect for my preventive care visit? Physical exam Your health care provider may check your:  Height and weight. These may be used to calculate your BMI (body mass index). BMI is a measurement that tells if you are at a healthy weight.  Heart rate and blood pressure.  Body temperature.  Skin for abnormal spots. Counseling Your health care provider may ask you questions about your:  Past medical problems.  Family's medical history.  Alcohol, tobacco, and drug use.  Emotional well-being.  Home life and relationship well-being.  Sexual activity.  Diet, exercise, and sleep habits.  Work and work Statistician.  Access to firearms.  Method of birth control.  Menstrual cycle.  Pregnancy history. What immunizations do I need? Vaccines are usually given at various ages, according to a schedule. Your health care provider will recommend vaccines for you based on your age, medical history, and lifestyle or other factors, such as travel or where you work.  What tests do I need? Blood tests  Lipid and cholesterol levels. These may be checked every 5 years starting at age 40.  Hepatitis C test.  Hepatitis B test. Screening  Diabetes screening. This is done by checking your blood sugar (glucose) after you have not eaten for a while (fasting).  STD (sexually transmitted disease) testing, if you are at risk.  BRCA-related cancer screening. This may be done if you have a family history of breast, ovarian, tubal, or peritoneal cancers.  Pelvic exam and Pap test. This may be done every 3 years starting at age 22. Starting at age 82, this may be done every 5 years if you have a Pap test in combination with an HPV test. Talk with your health care provider about your test results, treatment options, and if necessary, the need for more tests.   Follow these instructions at home: Eating and  drinking  Eat a healthy diet that includes fresh fruits and vegetables, whole grains, lean protein, and low-fat dairy products.  Take vitamin and mineral supplements as recommended by your health care provider.  Do not drink alcohol if: ? Your health care provider tells you not to drink. ? You are pregnant, may be pregnant, or are planning to become pregnant.  If you drink alcohol: ? Limit how much you have to 0-1 drink a day. ? Be aware of how much alcohol is in your drink. In the [place] one drink equals one 12 oz bottle of beer (355 mL), one 5 oz glass of wine (148 mL), or one 1 oz glass of hard liquor (44 mL).   Lifestyle  Take daily care of your teeth and gums. Brush your teeth every morning and night with fluoride toothpaste. Floss one time each day.  Stay active. Exercise for at least 30 minutes 5 or more days each week.  Do not use any products that contain nicotine or tobacco, such as cigarettes, e-cigarettes, and chewing tobacco. If you need help quitting, ask your health care provider.  Do not use drugs.  If you are sexually active, practice safe sex. Use a condom or other form of protection to prevent STIs (sexually transmitted infections).  If you do not wish to become pregnant, use a form of birth control. If you plan to become pregnant, see your health care provider for a prepregnancy visit.  Find healthy ways to cope with stress, such as: ? Meditation, yoga, or listening to music. ? Journaling. ? Talking to a trusted person. ? Spending time with friends and family. Safety  Always wear your seat belt while driving or riding in a vehicle.  Do not drive: ? If you have been drinking alcohol. Do not ride with someone who has been drinking. ? When you are tired or distracted. ? While texting.  Wear a helmet and other protective equipment during sports activities.  If you have firearms in your house, make sure you follow all gun safety procedures.  Seek help if  you have been physically or sexually abused. What's next?  Go to your health care provider once a year for an annual wellness visit.  Ask your health care provider how often you should have your eyes and teeth checked.  Stay up to date on all vaccines. This information is not intended to replace advice given to you by your health care provider. Make sure you discuss any questions you have with your health care provider. Document Revised: 02/23/2020 Document Reviewed: 03/08/2018 Elsevier Patient Education  2021  [employer].  Follow-up plan: Return in about 1 year (around 08/21/2021) for annual physical exam.  Clearnce Sorrel, DNP, APRN, FNP-BC [facility]

## 2020-08-25 LAB — CYTOLOGY - PAP
Comment: NEGATIVE
Diagnosis: NEGATIVE
High risk HPV: NEGATIVE

## 2021-05-26 ENCOUNTER — Telehealth: Payer: BC Managed Care – PPO | Admitting: Nurse Practitioner

## 2021-05-26 DIAGNOSIS — B3731 Acute candidiasis of vulva and vagina: Secondary | ICD-10-CM

## 2021-05-26 MED ORDER — FLUCONAZOLE 150 MG PO TABS
150.0000 mg | ORAL_TABLET | Freq: Once | ORAL | 0 refills | Status: AC
Start: 2021-05-26 — End: 2021-05-26

## 2021-05-26 NOTE — Progress Notes (Signed)
E-Visit for Vaginal Symptoms  We are sorry that you are not feeling well. Here is how we plan to help! Based on what you shared with me it looks like you: May have a yeast vaginosis This sounds more like yeast then it does bacterial vaginosis. Lets treat with diflucan and see how you do. You may want to use monistat for the buttocks area. If doe snot clear up, you may need to se your PCP. Vaginosis is an inflammation of the vagina that can result in discharge, itching and pain. The cause is usually a change in the normal balance of vaginal bacteria or an infection. Vaginosis can also result from reduced estrogen levels after menopause.  The most common causes of vaginosis are:   Bacterial vaginosis which results from an overgrowth of one on several organisms that are normally present in your vagina.   Yeast infections which are caused by a naturally occurring fungus called candida.   Vaginal atrophy (atrophic vaginosis) which results from the thinning of the vagina from reduced estrogen levels after menopause.   Trichomoniasis which is caused by a parasite and is commonly transmitted by sexual intercourse.  Factors that increase your risk of developing vaginosis include: Medications, such as antibiotics and steroids Uncontrolled diabetes Use of hygiene products such as bubble bath, vaginal spray or vaginal deodorant Douching Wearing damp or tight-fitting clothing Using an intrauterine device (IUD) for birth control Hormonal changes, such as those associated with pregnancy, birth control pills or menopause Sexual activity Having a sexually transmitted infection  Your treatment plan is A single Diflucan (fluconazole) 150mg  tablet once.  I have electronically sent this prescription into the pharmacy that you have chosen.  Be sure to take all of the medication as directed. Stop taking any medication if you develop a rash, tongue swelling or shortness of breath. Mothers who are breast  feeding should consider pumping and discarding their breast milk while on these antibiotics. However, there is no consensus that infant exposure at these doses would be harmful.  Remember that medication creams can weaken latex condoms.   HOME CARE:  Good hygiene may prevent some types of vaginosis from recurring and may relieve some symptoms:  Avoid baths, hot tubs and whirlpool spas. Rinse soap from your outer genital area after a shower, and dry the area well to prevent irritation. Don't use scented or harsh soaps, such as those with deodorant or antibacterial action. Avoid irritants. These include scented tampons and pads. Wipe from front to back after using the toilet. Doing so avoids spreading fecal bacteria to your vagina.  Other things that may help prevent vaginosis include:  Don't douche. Your vagina doesn't require cleansing other than normal bathing. Repetitive douching disrupts the normal organisms that reside in the vagina and can actually increase your risk of vaginal infection. Douching won't clear up a vaginal infection. Use a latex condom. Both female and female latex condoms may help you avoid infections spread by sexual contact. Wear cotton underwear. Also wear pantyhose with a cotton crotch. If you feel comfortable without it, skip wearing underwear to bed. Yeast thrives in [place] Your symptoms should improve in the next day or two.  GET HELP RIGHT AWAY IF:  You have pain in your lower abdomen ( pelvic area or over your ovaries) You develop nausea or vomiting You develop a fever Your discharge changes or worsens You have persistent pain with intercourse You develop shortness of breath, a rapid pulse, or you faint.  These  symptoms could be signs of problems or infections that need to be evaluated by a medical provider now.  MAKE SURE YOU   Understand these instructions. Will watch your condition. Will get help right away if you are not doing well or  get worse.  Thank you for choosing an e-visit.  Your e-visit answers were reviewed by a board certified advanced clinical practitioner to complete your personal care plan. Depending upon the condition, your plan could have included both over the counter or prescription medications.  Please review your pharmacy choice. Make sure the pharmacy is open so you can pick up prescription now. If there is a problem, you may contact your provider through [employer] and have the prescription routed to another pharmacy.  Your safety is important to [place]. If you have drug allergies check your prescription carefully.   For the next 24 hours you can use MyChart to ask questions about today's visit, request a non-urgent call back, or ask for a work or school excuse. You will get an email in the next two days asking about your experience. I hope that your e-visit has been valuable and will speed your recovery.  5-10 minutes spent reviewing and documenting in chart.

## 2021-08-03 NOTE — Progress Notes (Incomplete)
SCRIBE STATEMENT  Subjective:    Angelica Dickerson is a 36 y.o. female and is here to establish care and a comprehensive physical exam.  HPI  Health Maintenance Due  Topic Date Due   COVID-19 Vaccine (3 - Moderna risk series) 11/30/2019   INFLUENZA VACCINE  02/08/2021    Acute Concerns: ***  Chronic Issues: Hx of Shingles ***  Anxiety  Health Maintenance: Immunizations -- Covid- UTD Influenza- Due;2021 Tdap- UTD;2019 PAP -- UTD;2022 Bone Density -- N/A Diet -- Eats all food groups Sleep habits -- *** Exercise -- *** Current Weight -- *** Weight History: Wt Readings from Last 10 Encounters:  08/21/20 120 lb 6.4 oz (54.6 kg)  03/30/20 121 lb 6.4 oz (55.1 kg)  08/31/18 130 lb (59 kg)  07/29/18 148 lb (67.1 kg)  07/27/18 145 lb (65.8 kg)  07/20/18 146 lb (66.2 kg)  07/06/18 147 lb (66.7 kg)  06/05/18 142 lb (64.4 kg)  05/11/18 139 lb (63 kg)  03/16/18 130 lb (59 kg)   There is no height or weight on file to calculate BMI. Mood -- ***  No LMP recorded. Period characteristics -- *** Birth control -- ***    reports current alcohol use.  Tobacco Use: Low Risk    Smoking Tobacco Use: Never   Smokeless Tobacco Use: Never   Passive Exposure: Not on file     Depression screen [facility] 2/9 08/21/2020  Decreased Interest 0  Down, Depressed, Hopeless 0  PHQ - 2 Score 0  Altered sleeping -  Tired, decreased energy -  Change in appetite -  Feeling bad or failure about yourself  -  Trouble concentrating -  Moving slowly or fidgety/restless -  Suicidal thoughts -  PHQ-9 Score -  Difficult doing work/chores -     Other providers/specialists: Patient Care Team: Christen Butter, NP as PCP - General (Nurse Practitioner)   PMHx, SurgHx, SocialHx, Medications, and Allergies were reviewed in the Visit Navigator and updated as appropriate.   Past Medical History:  Diagnosis Date   History of shingles      Past Surgical History:  Procedure Laterality  Date   WISDOM TOOTH EXTRACTION  2009     Family History  Problem Relation Age of Onset   Hyperlipidemia Father    Hypertension Father    Diabetes Paternal Grandfather    Hyperlipidemia Paternal Grandfather    Diabetes Maternal Grandfather     Social History   Tobacco Use   Smoking status: Never   Smokeless tobacco: Never  Vaping Use   Vaping Use: Never used  Substance Use Topics   Alcohol use: Yes    Alcohol/week: 0.0 standard drinks    Comment: Occasionally   Drug use: Never    Review of Systems:   ROS Negative unless otherwise specified per HPI. Objective:   There were no vitals taken for this visit.  General Appearance:    Alert, cooperative, no distress, appears stated age  Head:    Normocephalic, without obvious abnormality, atraumatic  Eyes:    PERRL, conjunctiva/corneas clear, EOM's intact, fundi    benign, both eyes  Ears:    Normal TM's and external ear canals, both ears  Nose:   Nares normal, septum midline, mucosa normal, no drainage    or sinus tenderness  Throat:   Lips, mucosa, and tongue normal; teeth and gums normal  Neck:   Supple, symmetrical, trachea midline, no adenopathy;    thyroid:  no enlargement/tenderness/nodules; no carotid   bruit  or JVD  Back:     Symmetric, no curvature, ROM normal, no CVA tenderness  Lungs:     Clear to auscultation bilaterally, respirations unlabored  Chest Wall:    No tenderness or deformity   Heart:    Regular rate and rhythm, S1 and S2 normal, no murmur, rub   or gallop  Breast Exam:    Deferred  Abdomen:     Soft, non-tender, bowel sounds active all four quadrants,    no masses, no organomegaly  Genitalia:    Deferred  Rectal:    Deferred  Extremities:   Extremities normal, atraumatic, no cyanosis or edema  Pulses:   2+ and symmetric all extremities  Skin:   Skin color, texture, turgor normal, no rashes or lesions  Lymph nodes:   Cervical, supraclavicular, and axillary nodes normal  Neurologic:   CNII-XII  intact, normal strength, sensation and reflexes    throughout    Assessment/Plan:  Routine Physical Examination Today patient counseled on age appropriate routine health concerns for screening and prevention, each reviewed and up to date or declined. Immunizations reviewed and up to date or declined. Labs ordered and reviewed. Risk factors for depression reviewed and negative. Hearing function and visual acuity are intact. ADLs screened and addressed as needed. Functional ability and level of safety reviewed and appropriate. Education, counseling and referrals performed based on assessed risks today. Patient provided with a copy of personalized plan for preventive services.      Patient Counseling:   [x]     Nutrition: Stressed importance of moderation in sodium/caffeine intake, saturated fat and cholesterol, caloric balance, sufficient intake of fresh fruits, vegetables, fiber, calcium, iron, and 1 mg of folate supplement per day (for females capable of pregnancy).   [x]      Stressed the importance of regular exercise.    [x]     Substance Abuse: Discussed cessation/primary prevention of tobacco, alcohol, or other drug use; driving or other dangerous activities under the influence; availability of treatment for abuse.    [x]      Injury prevention: Discussed safety belts, safety helmets, smoke detector, smoking near bedding or upholstery.    [x]      Sexuality: Discussed sexually transmitted diseases, partner selection, use of condoms, avoidance of unintended pregnancy  and contraceptive alternatives.    [x]     Dental health: Discussed importance of regular tooth brushing, flossing, and dental visits.   [x]      Health maintenance and immunizations reviewed. Please refer to Health maintenance section.   I,Havlyn C Ratchford,acting as a for , PA.,have documented all relevant documentation on the behalf of , PA,as directed by  , PA  while in the presence of , .  ***  , PA-C [facility]

## 2021-08-04 ENCOUNTER — Encounter: Payer: Self-pay | Admitting: Physician Assistant

## 2021-08-04 ENCOUNTER — Other Ambulatory Visit: Payer: Self-pay

## 2021-08-04 ENCOUNTER — Ambulatory Visit: Payer: BC Managed Care – PPO | Admitting: Physician Assistant

## 2021-08-04 VITALS — BP 110/78 | HR 91 | Temp 98.2°F | Ht 66.0 in | Wt 121.2 lb

## 2021-08-04 DIAGNOSIS — R1011 Right upper quadrant pain: Secondary | ICD-10-CM | POA: Diagnosis not present

## 2021-08-04 LAB — COMPREHENSIVE METABOLIC PANEL
ALT: 17 U/L (ref 0–35)
AST: 18 U/L (ref 0–37)
Albumin: 4.4 g/dL (ref 3.5–5.2)
Alkaline Phosphatase: 54 U/L (ref 39–117)
BUN: 15 mg/dL (ref 6–23)
CO2: 28 mEq/L (ref 19–32)
Calcium: 9.3 mg/dL (ref 8.4–10.5)
Chloride: 107 mEq/L (ref 96–112)
Creatinine, Ser: 0.86 mg/dL (ref 0.40–1.20)
GFR: 87.15 mL/min (ref >60.00)
Glucose, Bld: 89 mg/dL (ref 70–99)
Potassium: 4.3 mEq/L (ref 3.5–5.1)
Sodium: 140 mEq/L (ref 135–145)
Total Bilirubin: 0.5 mg/dL (ref 0.2–1.2)
Total Protein: 7.3 g/dL (ref 6.0–8.3)

## 2021-08-04 LAB — URINALYSIS, ROUTINE W REFLEX MICROSCOPIC
Bilirubin Urine: NEGATIVE
Ketones, ur: NEGATIVE
Leukocytes,Ua: NEGATIVE
Nitrite: NEGATIVE
Specific Gravity, Urine: 1.025 (ref 1.000–1.030)
Total Protein, Urine: NEGATIVE
Urine Glucose: NEGATIVE
Urobilinogen, UA: 0.2 (ref 0.0–1.0)
pH: 6 (ref 5.0–8.0)

## 2021-08-04 LAB — CBC WITH DIFFERENTIAL/PLATELET
Basophils Absolute: 0 K/uL (ref 0.0–0.1)
Basophils Relative: 0.3 % (ref 0.0–3.0)
Eosinophils Absolute: 0.1 K/uL (ref 0.0–0.7)
Eosinophils Relative: 1.2 % (ref 0.0–5.0)
HCT: 39 % (ref 36.0–46.0)
Hemoglobin: 12.8 g/dL (ref 12.0–15.0)
Lymphocytes Relative: 33.1 % (ref 12.0–46.0)
Lymphs Abs: 1.7 K/uL (ref 0.7–4.0)
MCHC: 32.8 g/dL (ref 30.0–36.0)
MCV: 88.5 fl (ref 78.0–100.0)
Monocytes Absolute: 0.3 K/uL (ref 0.1–1.0)
Monocytes Relative: 4.9 % (ref 3.0–12.0)
Neutro Abs: 3.1 K/uL (ref 1.4–7.7)
Neutrophils Relative %: 60.5 % (ref 43.0–77.0)
Platelets: 153 K/uL (ref 150.0–400.0)
RBC: 4.41 Mil/uL (ref 3.87–5.11)
RDW: 13.5 % (ref 11.5–15.5)
WBC: 5.2 K/uL (ref 4.0–10.5)

## 2021-08-04 LAB — LIPASE: Lipase: 27 U/L (ref 11.0–59.0)

## 2021-08-04 NOTE — Patient Instructions (Signed)
It was great to see you!  We are going to update blood work and urine studies today  Please consider taking oral ibuprofen for you symptoms Alternatively, you can trial topical Voltaren gel   A little goes a long way so you can use about a pea-sized amount for each area.  Spread this small amount over the area into a thin film and let it dry.  Be sure that you do not rub the gel into your skin for more than 10 or 15 seconds otherwise it can irritate you skin.   Once you apply the gel, please do not put any other lotion or clothing in contact with that area for 30 minutes to allow the gel to absorb into your skin.     Let's follow-up in 4-6 weeks for a physical and to check in on these symptoms, sooner if you have concerns.  Take care,  Jarold Motto PA-C

## 2021-08-04 NOTE — Progress Notes (Signed)
Angelica Dickerson is a 36 y.o. female here to establish care.  History of Present Illness:   Chief Complaint  Patient presents with   Establish Care   c/o rib cage discomfort    Pt has been having discomfort right rib cage area off & on past month, also having cramping in that area.    RUQ pain Angelica Dickerson states she has been experiencing intermittent discomfort underneath her right rib cage.  Angelica Dickerson compares the nagging sensation to be similar to when her children would move their foot under her ribs during pregnancy. In an effort to manage current sx, she has tried stretching which has provided minor relief. Angelica Dickerson does reports that she did participate in pelvic floor therapy following the birth of her second child, but was told there were no signs of diastasis.   Upon further discussion she did admit to experiencing  a cramp in the same area about 6 months ago. Notes she was able to see her muscle cramping and it was slightly raised. This lasted for a short period of time and has since resolved. Denies changes in BM, recent/past injury, hx of kidney stones, nausea, vomiting, abnormal weight changes, significant alcohol intake, or changes in appetite. Angelica Dickerson pain is 2/10.  Anxiety Pt reports she has participated in talk therapy for this issue in the past but has since discontinued due to therapist feeling as though she were progressing well. She has never taken medication for this, per her report. States that she has learned healthy coping skills and is doing well at this time. Denies SI/HI.   Depression screen [facility] 2/9 08/04/2021  Decreased Interest 0  Down, Depressed, Hopeless 0  PHQ - 2 Score 0  Altered sleeping -  Tired, decreased energy -  Change in appetite -  Feeling bad or failure about yourself  -  Trouble concentrating -  Moving slowly or fidgety/restless -  Suicidal thoughts -  PHQ-9 Score -  Difficult doing work/chores -    GAD 7 : Generalized Anxiety Score  08/04/2021 03/30/2020  Nervous, Anxious, on Edge 1 1  Control/stop worrying 0 1  Worry too much - different things 0 0  Trouble relaxing 1 0  Restless 0 0  Easily annoyed or irritable 1 1  Afraid - awful might happen 0 1  Total GAD 7 Score 3 4  Anxiety Difficulty Not difficult at all Not difficult at all     Other providers/specialists: Patient Care Team: Jarold Motto, [place] as PCP - General (Physician Assistant)   Past Medical History:  Diagnosis Date   Allergy 2000   Amoxycillin, Seasonal Allergies   Anxiety 2021   Currently under care of licensed therapist   History of shingles    Vaginal delivery    2015, 2020     Social History   Tobacco Use   Smoking status: Never   Smokeless tobacco: Never  Vaping Use   Vaping Use: Never used  Substance Use Topics   Alcohol use: Yes    Alcohol/week: 1.0 standard drink    Types: 1 Glasses of wine per week    Comment: Once to twice a month   Drug use: Never    Past Surgical History:  Procedure Laterality Date   WISDOM TOOTH EXTRACTION  07/12/2007    Family History  Problem Relation Age of Onset   Arthritis Mother    Depression Mother    Hyperlipidemia Father    Hypertension Father    Arthritis Maternal Grandmother  Cancer Maternal Grandmother        stomach   Depression Maternal Grandmother    Diabetes Maternal Grandfather    Hearing loss Paternal Grandmother    Stroke Paternal Grandmother    Diabetes Paternal Grandfather    Hyperlipidemia Paternal Grandfather    Breast cancer Neg Hx    Colon cancer Neg Hx     Allergies  Allergen Reactions   Amoxicillin Hives and Itching     Current Medications:   Current Outpatient Medications:    Lactobacillus (PROBIOTIC ACIDOPHILUS PO), Take 1 Can by mouth daily. , Disp: , Rfl:    Multiple Vitamin tablet, Take 1 tablet by mouth daily., Disp: , Rfl:    Review of Systems:   ROS Negative unless otherwise specified per HPI. Vitals:   Vitals:   08/04/21 0808   BP: 110/78  Pulse: 91  Temp: 98.2 F (36.8 C)  TempSrc: Temporal  SpO2: 99%  Weight: 121 lb 4 oz (55 kg)  Height: 5\' 6"  (1.676 m)      Body mass index is 19.57 kg/m.  Physical Exam:   Physical Exam Vitals and nursing note reviewed.  Constitutional:      General: She is not in acute distress.    Appearance: She is well-developed. She is not ill-appearing or toxic-appearing.  Cardiovascular:     Rate and Rhythm: Normal rate and regular rhythm.     Pulses: Normal pulses.     Heart sounds: Normal heart sounds, S1 normal and S2 normal.  Pulmonary:     Effort: Pulmonary effort is normal.     Breath sounds: Normal breath sounds.  Skin:    General: Skin is warm and dry.  Neurological:     Mental Status: She is alert.     GCS: GCS eye subscore is 4. GCS verbal subscore is 5. GCS motor subscore is 6.  Psychiatric:        Speech: Speech normal.        Behavior: Behavior normal. Behavior is cooperative.     Assessment and Plan:   RUQ pain No red flags on exam Unclear etiology Differential is broad -- MSK pain, gas, gallbladder pathology, PUD, among others Update labs today, will make recommendations accordingly  Consider taking oral ibuprofen for pain relief as needed Trial OTC Voltaren topical gel  Consider u/s if worsens or abnormal labs Follow up in 4-6 weeks for a physical, sooner if concerns  Anxiety Well controlled per patient Continue to monitor  I,Havlyn C Ratchford,acting as a scribe for , PA.,have documented all relevant documentation on the behalf of [employer], PA,as directed by  Jarold Motto, PA while in the presence of Jarold Motto, Jarold Motto.  I, [place], Jarold Motto, have reviewed all documentation for this visit. The documentation on 08/04/21 for the exam, diagnosis, procedures, and orders are all accurate and complete.   08/06/21, PA-C

## 2021-11-29 ENCOUNTER — Encounter (HOSPITAL_BASED_OUTPATIENT_CLINIC_OR_DEPARTMENT_OTHER): Payer: Self-pay

## 2021-11-29 ENCOUNTER — Other Ambulatory Visit: Payer: Self-pay

## 2021-11-29 ENCOUNTER — Emergency Department (HOSPITAL_BASED_OUTPATIENT_CLINIC_OR_DEPARTMENT_OTHER)
Admission: EM | Admit: 2021-11-29 | Discharge: 2021-11-29 | Disposition: A | Discharge status: Home / Self Care | Payer: BC Managed Care – PPO | Attending: Emergency Medicine | Admitting: Emergency Medicine

## 2021-11-29 DIAGNOSIS — R110 Nausea: Secondary | ICD-10-CM | POA: Diagnosis not present

## 2021-11-29 DIAGNOSIS — R002 Palpitations: Secondary | ICD-10-CM | POA: Insufficient documentation

## 2021-11-29 LAB — CBC WITH DIFFERENTIAL/PLATELET
Abs Immature Granulocytes: 0.01 10*3/uL (ref 0.00–0.07)
Basophils Absolute: 0 10*3/uL (ref 0.0–0.1)
Basophils Relative: 0 %
Eosinophils Absolute: 0 10*3/uL (ref 0.0–0.5)
Eosinophils Relative: 0 %
HCT: 36.7 % (ref 36.0–46.0)
Hemoglobin: 12 g/dL (ref 12.0–15.0)
Immature Granulocytes: 0 %
Lymphocytes Relative: 22 %
Lymphs Abs: 1.5 10*3/uL (ref 0.7–4.0)
MCH: 28.9 pg (ref 26.0–34.0)
MCHC: 32.7 g/dL (ref 30.0–36.0)
MCV: 88.4 fL (ref 80.0–100.0)
Monocytes Absolute: 0.3 10*3/uL (ref 0.1–1.0)
Monocytes Relative: 4 %
Neutro Abs: 4.8 10*3/uL (ref 1.7–7.7)
Neutrophils Relative %: 74 %
Platelets: 146 10*3/uL — ABNORMAL LOW (ref 150–400)
RBC: 4.15 MIL/uL (ref 3.87–5.11)
RDW: 12.9 % (ref 11.5–15.5)
WBC: 6.7 10*3/uL (ref 4.0–10.5)
nRBC: 0 % (ref 0.0–0.2)

## 2021-11-29 LAB — BASIC METABOLIC PANEL
Anion gap: 6 (ref 5–15)
BUN: 19 mg/dL (ref 6–20)
CO2: 26 mmol/L (ref 22–32)
Calcium: 9.6 mg/dL (ref 8.9–10.3)
Chloride: 107 mmol/L (ref 98–111)
Creatinine, Ser: 0.98 mg/dL (ref 0.44–1.00)
GFR, Estimated: >60 mL/min (ref >60)
Glucose, Bld: 119 mg/dL — ABNORMAL HIGH (ref 70–99)
Potassium: 4.1 mmol/L (ref 3.5–5.1)
Sodium: 139 mmol/L (ref 135–145)

## 2021-11-29 LAB — HCG, SERUM, QUALITATIVE: Preg, Serum: NEGATIVE

## 2021-11-29 LAB — MAGNESIUM: Magnesium: 2 mg/dL (ref 1.7–2.4)

## 2021-11-29 NOTE — ED Provider Notes (Signed)
[facility] EMERGENCY DEPT Provider Note   CSN: [id number] Arrival date & time: 11/29/21  0220     History  Chief Complaint  Patient presents with   Palpitations   Nausea    Angelica Dickerson is a 36 y.o. female.  HPI     This is a 36 year old female who presents with palpitations.  Patient reports that she got in bed around 11 PM.  She had a sensation that her heart was skipping a beat.  She got up and put her Apple Watch on and noted her heart rate in the 130s and it was displaying atrial fibrillation.  She states that she felt nauseated and subsequently developing tension type pressure in the back of her head.  She has no history of arrhythmias.  She does state that this is similar to when she was evaluated 2 years ago for near syncope.  However, at that time she was not having palpitations.  She denies any recent dietary changes or changes in medications.  She denies alcohol or drug use.  She denies any increased caffeine use.  Home Medications Prior to Admission medications   Medication Sig Start Date End Date Taking? Authorizing Provider  Lactobacillus (PROBIOTIC ACIDOPHILUS PO) Take 1 Can by mouth daily.     [provider]  Multiple Vitamin tablet Take 1 tablet by mouth daily.    [provider]      Allergies    Amoxicillin    Review of Systems   Review of Systems  Constitutional:  Negative for fever.  Respiratory:  Negative for shortness of breath.   Cardiovascular:  Positive for palpitations. Negative for chest pain.  All other systems reviewed and are negative.  Physical Exam Updated Vital Signs BP 107/63   Pulse 67   Temp 98.2 F (36.8 C) (Oral)   Resp 14   Ht 1.676 m (5\' 6" )   Wt 54.4 kg   LMP 11/22/2021 (Exact Date)   SpO2 100%   BMI 19.37 kg/m  Physical Exam Vitals and nursing note reviewed.  Constitutional:      Appearance: She is well-developed. She is not ill-appearing.  HENT:     Head:  Normocephalic and atraumatic.     Mouth/Throat:     Mouth: Mucous membranes are moist.  Eyes:     Pupils: Pupils are equal, round, and reactive to light.  Cardiovascular:     Rate and Rhythm: Normal rate and regular rhythm.     Heart sounds: Normal heart sounds. No murmur heard. Pulmonary:     Effort: Pulmonary effort is normal. No respiratory distress.     Breath sounds: No wheezing.  Abdominal:     Palpations: Abdomen is soft.     Tenderness: There is no abdominal tenderness.  Musculoskeletal:     Cervical back: Neck supple.     Right lower leg: No edema.     Left lower leg: No edema.  Skin:    General: Skin is warm and dry.  Neurological:     Mental Status: She is alert and oriented to person, place, and time.  Psychiatric:        Mood and Affect: Mood normal.    ED Results / Procedures / Treatments   Labs (all labs ordered are listed, but only abnormal results are displayed) Labs Reviewed  CBC WITH DIFFERENTIAL/PLATELET - Abnormal; Notable for the following components:      Result Value   Platelets 146 (*)    All other components  within normal limits  BASIC METABOLIC PANEL - Abnormal; Notable for the following components:   Glucose, Bld 119 (*)    All other components within normal limits  MAGNESIUM  HCG, SERUM, QUALITATIVE    EKG EKG Interpretation  Date/Time:  Monday Nov 29 2021 02:28:41 EDT Ventricular Rate:  86 PR Interval:  153 QRS Duration: 93 QT Interval:  357 QTC Calculation: 427 R Axis:   83 Text Interpretation: Sinus rhythm Right atrial enlargement Consider right ventricular hypertrophy No significant change since last tracing Confirmed by Ross Marcus ([id number]) on 11/29/2021 3:07:30 AM  Radiology No results found.  Procedures Procedures    Medications Ordered in ED Medications - No data to display  ED Course/ Medical Decision Making/ A&P                           Medical Decision Making Amount and/or Complexity of Data Reviewed Labs:  ordered.   This patient presents to the ED for concern of palpitations, this involves an extensive number of treatment options, and is a complaint that carries with it a high risk of complications and morbidity.  I considered the following differential and admission for this acute, potentially life threatening condition.  The differential diagnosis includes arrhythmia such as SVT or A-fib metabolic derangement, thyroid disorder, less likely ACS PE.  MDM:    This is a 36 year old female who presents with palpitations.  She is nontoxic and vital signs are reassuring.  Today her EKG shows normal sinus rhythm.  It is unchanged from her EKG 2 years ago.  Overall she is no longer having palpitations.  Basic lab work was obtained.  No significant metabolic derangements.  Normal magnesium.  Her presentation is not consistent with ACS.  She is PERC negative and doubt PE.  She is not having any chest pain and breath sounds are clear.  No indication of thyroid storm.  Will defer thyroid testing to her primary physician as she is not having any ongoing vital sign abnormalities.  Labs reviewed.  No significant metabolic derangements.  Patient remained clinically stable.  No recurrence of symptoms and cardiac monitoring reviewed and shows no evidence of arrhythmia.  Do feel she would benefit from cardiology evaluation this morning.  Ambulatory referral ordered.  We discussed avoiding stimulant medications and increased caffeine.  Patient agrees with plan.  (Labs, imaging, consults)  Labs: I Ordered, and personally interpreted labs.  The pertinent results include: Normal CBC, CMP, pregnancy  Imaging Studies ordered: I ordered imaging studies including none I independently visualized and interpreted imaging. I agree with the radiologist interpretation  Additional history obtained from chart review.  External records from outside source obtained and reviewed including prior evaluations  Cardiac Monitoring: The  patient was maintained on a cardiac monitor.  I personally viewed and interpreted the cardiac monitored which showed an underlying rhythm of: Normal sinus rhythm  Reevaluation: After the interventions noted above, I reevaluated the patient and found that they have :improved  Social Determinants of Health: Lives independently  Disposition: Discharge  Co morbidities that complicate the patient evaluation  Past Medical History:  Diagnosis Date   Allergy 2000   Amoxycillin, Seasonal Allergies   Anxiety 2021   Currently under care of licensed therapist   History of shingles    Vaginal delivery    2015, 2020     Medicines No orders of the defined types were placed in this encounter.   I have reviewed the  patients home medicines and have made adjustments as needed  Problem List / ED Course: Problem List Items Addressed This Visit   None Visit Diagnoses     Palpitations    -  Primary   Relevant Orders   Ambulatory referral to Cardiology          \        Final Clinical Impression(s) / ED Diagnoses Final diagnoses:  Palpitations    Rx / DC Orders ED Discharge Orders          Ordered    Ambulatory referral to Cardiology       Comments: If you have not heard from the Cardiology office within the next 72 hours please call [phone].   11/29/21 [id number]              Wilkie AyeHorton, Mayer Maskerourtney F, MD 11/29/21 0430

## 2021-11-29 NOTE — ED Triage Notes (Signed)
Reports going to bed around 11PM and had a palpitation sensation looked at apple watch and it displayed a-fib heart rhythm. She began feeling nauseated and had a "tension/ pressure sensation" in the back of head. Says it is a similar feeling to when she presented to ER 2 years ago.

## 2021-11-29 NOTE — Discharge Instructions (Signed)
You were seen today for palpitations.  Your work-up is reassuring.  Follow-up with cardiology.  A referral has been placed.  You should receive a phone call from cardiology clinic.  Monitor your symptoms closely.  Avoid alcohol and caffeine.  Avoid any other stimulant medications or drugs.

## 2021-11-30 NOTE — Progress Notes (Unsigned)
Cardiology Office Note:   Date:  12/01/2021  NAME:  Angelica Dickerson    MRN: [id number] DOB:  01/17/1986   PCP:  Inda Coke, PA  Cardiologist:  None  Electrophysiologist:  None   Referring MD: Inda Coke, [place]   Chief Complaint  Patient presents with   Palpitations    History of Present Illness:   Angelica Dickerson is a 36 y.o. female with a hx of allergies who is being seen today for the evaluation of palpitations at the request of Inda Coke, [place]. Seen in the ER 11/29/2021 for palpitations. Work-up negative.  She reports she was seen in the emergency room 2 nights ago.  Apparently felt an irregular heartbeat.  She felt her heart was skipping beats.  She had no chest pain or trouble breathing.  Symptoms occur with laying down to go to bed.  She reports her blood pressure was elevated but came down.  She felt nauseous and went to the emergency room.  She does have an Apple watch.  The EKG tracings read as atrial fibrillation.  I did review the tracings.  She does not have evidence of atrial fibrillation.  It was a very poor quality tracing.  She went to the emergency room and work-up was negative.  No recent thyroid studies.  EKG does demonstrate sinus rhythm.  Computer labeled it as right ventricular hypertrophy but I do not believe this is the case.  Her examination is quite normal.  She does have a family history of atrial fibrillation and arrhythmia in the family.  She has never had a heart attack or stroke.  She has no medical problems.  She works as a Geophysicist/field seismologist.  She is a mother of 2.  She is married.  No sleep apnea concerns.  She drinks plenty of water.  No excess caffeine consumption.  No excess stress.  She exercises regularly.  Denies any recurrent episodes.  Had a similar episode 2 years ago.  Has not had any further episodes since her visit to the emergency room.  Vital signs are stable.  Past Medical History: Past Medical History:  Diagnosis Date    Allergy 2000   Amoxycillin, Seasonal Allergies   Anxiety 2021   Currently under care of licensed therapist   History of shingles    Vaginal delivery    2015, 2020    Past Surgical History: Past Surgical History:  Procedure Laterality Date   WISDOM TOOTH EXTRACTION  07/12/2007    Current Medications: No outpatient medications have been marked as taking for the 12/01/21 encounter (Office Visit) with Geralynn Rile, MD.     Allergies:    Amoxicillin   Social History: Social History   Socioeconomic History   Marital status: Married    Spouse name: Not on file   Number of children: 2   Years of education: Not on file   Highest education level: Not on file  Occupational History   Occupation: Geophysicist/field seismologist  Tobacco Use   Smoking status: Never   Smokeless tobacco: Never  Vaping Use   Vaping Use: Never used  Substance and Sexual Activity   Alcohol use: Yes    Alcohol/week: 1.0 standard drink    Types: 1 Glasses of wine per week    Comment: Once to twice a month   Drug use: Never   Sexual activity: Yes    Partners: Male    Birth control/protection: Surgical, None    Comment: Husband had vasectomy in Nov. 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 Geophysicist/field seismologist   Husband   9 year old boy and 1st grader   Social Determinants of Health   Financial Resource Strain: Not on file  Food Insecurity: Not on file  Transportation Needs: Not on file  Physical Activity: Not on file  Stress: Not on file  Social Connections: Not on file     Family History: The patient's family history includes Arthritis in her maternal grandmother and mother; Cancer in her maternal grandmother; Depression in her maternal grandmother and mother; Diabetes in her maternal grandfather and paternal grandfather; Hearing loss in her paternal grandmother; Hyperlipidemia in her father and paternal grandfather; Hypertension in her father;  Stroke in her paternal grandmother. There is no history of Breast cancer or Colon cancer.  ROS:   All other ROS reviewed and negative. Pertinent positives noted in the HPI.     EKGs/Labs/Other Studies Reviewed:   The following studies were personally reviewed by me today:  EKG:  EKG reviewed from the emergency room dated 11/29/2021.  This demonstrates sinus rhythm with no acute ischemic changes.  Recent Labs: 08/04/2021: ALT 17 11/29/2021: BUN 19; Creatinine, Ser 0.98; Hemoglobin 12.0; Magnesium 2.0; Platelets 146; Potassium 4.1; Sodium 139   Recent Lipid Panel    Component Value Date/Time   CHOL 164 04/06/2020 0752   TRIG 75 04/06/2020 0752   HDL 57 04/06/2020 0752   CHOLHDL 2.9 04/06/2020 0752   LDLCALC 91 04/06/2020 0752    Physical Exam:   VS:  BP 110/78   Pulse 86   Ht 5\' 6"  (1.676 m)   Wt 127 lb 3.2 oz (57.7 kg)   LMP 11/22/2021 (Exact Date)   SpO2 96%   BMI 20.53 kg/m    Wt Readings from Last 3 Encounters:  12/01/21 127 lb 3.2 oz (57.7 kg)  11/29/21 120 lb (54.4 kg)  08/04/21 121 lb 4 oz (55 kg)    General: Well nourished, well developed, in no acute distress Head: Atraumatic, normal size  Eyes: PEERLA, EOMI  Neck: Supple, no JVD Endocrine: No thryomegaly Cardiac: Normal S1, S2; RRR; no murmurs, rubs, or gallops Lungs: Clear to auscultation bilaterally, no wheezing, rhonchi or rales  Abd: Soft, nontender, no hepatomegaly  Ext: No edema, pulses 2+ Musculoskeletal: No deformities, BUE and BLE strength normal and equal Skin: Warm and dry, no rashes   Neuro: Alert and oriented to person, place, time, and situation, CNII-XII grossly intact, no focal deficits  Psych: Normal mood and affect   ASSESSMENT:   Deylin Ivens is a 36 y.o. female who presents for the following: 1. Palpitations   2. Tachycardia     PLAN:   1. Palpitations 2. Tachycardia -She describes an episode of palpitations and tachycardia the other night.  Needs an updated TSH.  EKG  really unremarkable.  Cardiovascular examination unremarkable.  Unclear what was found on her Apple Watch EKG.  The tracing is poor quality.  I do not believe this represents atrial fibrillation.  She had no further symptoms.  Had a similar episode 2 years ago.  For now we will continue with watchful waiting.  If symptoms recur we could pursue a formal monitor but she is not having any symptoms so I see no need for this.  In the interim I recommended regular hydration, exercise, heart healthy diet.  She does this anyway.  She will see [place] back as needed based  on recurrence of symptoms.  I would like to get an echocardiogram just to determine if she has any structural abnormality.  She does describe a family history of atrial fibrillation so it may be worthwhile to explore this.  Given her normal EKG and normal examination this is likely low yield but given 2 episodes of palpitations I think this is warranted.      Disposition: Return if symptoms worsen or fail to improve.  Medication Adjustments/Labs and Tests Ordered: Current medicines are reviewed at length with the patient today.  Concerns regarding medicines are outlined above.  Orders Placed This Encounter  Procedures   TSH   ECHOCARDIOGRAM COMPLETE   No orders of the defined types were placed in this encounter.   Patient Instructions  Medication Instructions:  The current medical regimen is effective;  continue present plan and medications.  *If you need a refill on your cardiac medications before your next appointment, please call your pharmacy*   Lab Work: TSH today   If you have labs (blood work) drawn today and your tests are completely normal, you will receive your results only by: [place] (if you have MyChart) OR A paper copy in the mail If you have any lab test that is abnormal or we need to change your treatment, we will call you to review the results.   Testing/Procedures: Echocardiogram - Your physician has  requested that you have an echocardiogram. Echocardiography is a painless test that uses sound waves to create images of your heart. It provides your doctor with information about the size and shape of your heart and how well your heart's chambers and valves are working. This procedure takes approximately one hour. There are no restrictions for this procedure.    Follow-Up: At [facility], you and your health needs are our priority.  As part of our continuing mission to provide you with exceptional heart care, we have created designated Provider Care Teams.  These Care Teams include your primary Cardiologist (physician) and Advanced Practice Providers (APPs -  Physician Assistants and Nurse Practitioners) who all work together to provide you with the care you need, when you need it.  We recommend signing up for the patient portal called "MyChart".  Sign up information is provided on this After Visit Summary.  MyChart is used to connect with patients for Virtual Visits (Telemedicine).  Patients are able to view lab/test results, encounter notes, upcoming appointments, etc.  Non-urgent messages can be sent to your provider as well.   To learn more about what you can do with MyChart, go to [email or web address].    Your next appointment:   As needed  The format for your next appointment:   In Person  Provider:   Eleonore Chiquito, MD            Signed, Addison Naegeli. Audie Box, MD, [place]  [address] [place], [place] [address] [phone]  12/01/2021 9:31 AM

## 2021-12-01 ENCOUNTER — Ambulatory Visit: Payer: BC Managed Care – PPO | Admitting: Cardiovascular Disease

## 2021-12-01 ENCOUNTER — Encounter: Payer: Self-pay | Admitting: Cardiovascular Disease

## 2021-12-01 VITALS — BP 110/78 | HR 86 | Ht 66.0 in | Wt 127.2 lb

## 2021-12-01 DIAGNOSIS — R002 Palpitations: Secondary | ICD-10-CM

## 2021-12-01 DIAGNOSIS — R000 Tachycardia, unspecified: Secondary | ICD-10-CM | POA: Diagnosis not present

## 2021-12-01 LAB — TSH: TSH: 1.42 u[IU]/mL (ref 0.450–4.500)

## 2021-12-01 NOTE — Patient Instructions (Signed)
Medication Instructions:  The current medical regimen is effective;  continue present plan and medications.  *If you need a refill on your cardiac medications before your next appointment, please call your pharmacy*   Lab Work: TSH today   If you have labs (blood work) drawn today and your tests are completely normal, you will receive your results only by: MyChart Message (if you have MyChart) OR A paper copy in the mail If you have any lab test that is abnormal or we need to change your treatment, we will call you to review the results.   Testing/Procedures: Echocardiogram - Your physician has requested that you have an echocardiogram. Echocardiography is a painless test that uses sound waves to create images of your heart. It provides your doctor with information about the size and shape of your heart and how well your heart's chambers and valves are working. This procedure takes approximately one hour. There are no restrictions for this procedure.     Follow-Up: At [facility], you and your health needs are our priority.  As part of our continuing mission to provide you with exceptional heart care, we have created designated Provider Care Teams.  These Care Teams include your primary Cardiologist (physician) and Advanced Practice Providers (APPs -  Physician Assistants and Nurse Practitioners) who all work together to provide you with the care you need, when you need it.  We recommend signing up for the patient portal called "MyChart".  Sign up information is provided on this After Visit Summary.  MyChart is used to connect with patients for Virtual Visits (Telemedicine).  Patients are able to view lab/test results, encounter notes, upcoming appointments, etc.  Non-urgent messages can be sent to your provider as well.   To learn more about what you can do with MyChart, go to [email or web address].    Your next appointment:   As needed  The format for your next appointment:   In  Person  Provider:   Jasper O'Neal, MD           

## 2021-12-13 ENCOUNTER — Ambulatory Visit (INDEPENDENT_AMBULATORY_CARE_PROVIDER_SITE_OTHER): Payer: BC Managed Care – PPO

## 2021-12-13 DIAGNOSIS — R000 Tachycardia, unspecified: Secondary | ICD-10-CM

## 2021-12-13 LAB — ECHOCARDIOGRAM COMPLETE
AR max vel: 1.81 cm2
AV Area VTI: 1.98 cm2
AV Area mean vel: 2 cm2
AV Mean grad: 3 mmHg
AV Peak grad: 6.1 mmHg
Ao pk vel: 1.23 m/s
Area-P 1/2: 3.17 cm2
S' Lateral: 2.44 cm

## 2022-02-15 DIAGNOSIS — L821 Other seborrheic keratosis: Secondary | ICD-10-CM | POA: Insufficient documentation

## 2022-04-04 ENCOUNTER — Encounter: Payer: Self-pay | Admitting: *Deleted

## 2022-04-23 ENCOUNTER — Telehealth: Payer: BC Managed Care – PPO | Admitting: Physician Assistant

## 2022-04-23 DIAGNOSIS — B3731 Acute candidiasis of vulva and vagina: Secondary | ICD-10-CM

## 2022-04-23 MED ORDER — FLUCONAZOLE 150 MG PO TABS: 150.0000 mg | ORAL_TABLET | ORAL | 0 refills | Status: DC | PRN

## 2022-04-23 NOTE — Progress Notes (Signed)

## 2022-06-23 ENCOUNTER — Encounter: Payer: Self-pay | Admitting: *Deleted

## 2022-08-16 NOTE — Progress Notes (Signed)
Subjective:    Angelica Dickerson is a 37 y.o. female and is here for a comprehensive physical exam.  HPI  There are no preventive care reminders to display for this patient.   Acute Concerns: None  Chronic Issues: Palpitations/Tachycardia Had an isolated instance on the night of 11/28/21 when she had the feeling of an irregular heartbeat. Was seen in the ER 11/29/21. Work-up negative. Followed up with her cardiologist on 12/01/21. Denies angina symptoms now.  Denies swelling in ankles/legs. Followed by her dermatologist.   Health Maintenance: Immunizations -- Due for flu, Covid-19 vaccines. UTD on tetanus vaccine. Colonoscopy -- N/A Mammogram -- N/A PAP -- Last completed 08/21/20. Negative for intraepithelial lesion/malignancy. Recommended repeat in 2025. Bone Density -- N/A Diet -- Eating healthy. Watching diet more carefully since last visit. Exercise -- Exercising regularly with light weight-bearing exercises.  Sleep habits -- Good. Has trouble with staying up late. Mood -- Okay.  UTD with dentist? - UTD UTD with eye doctor? - UTD  Weight history: Wt Readings from Last 10 Encounters:  08/17/22 128 lb 4 oz (58.2 kg)  12/01/21 127 lb 3.2 oz (57.7 kg)  11/29/21 120 lb (54.4 kg)  08/04/21 121 lb 4 oz (55 kg)  08/21/20 120 lb 6.4 oz (54.6 kg)  03/30/20 121 lb 6.4 oz (55.1 kg)  08/31/18 130 lb (59 kg)  07/29/18 148 lb (67.1 kg)  07/27/18 145 lb (65.8 kg)  07/20/18 146 lb (66.2 kg)   Body mass index is 20.39 kg/m. Patient's last menstrual period was 08/03/2022 (approximate).  Alcohol use:  reports current alcohol use of about 1.0 standard drink of alcohol per week.  Tobacco use:  Tobacco Use: Low Risk  (08/17/2022)   Patient History    Smoking Tobacco Use: Never    Smokeless Tobacco Use: Never    Passive Exposure: Not on file   Eligible for lung cancer screening? No     08/17/2022    8:44 AM  Depression screen PHQ 2/9  Decreased Interest 0  Down,  Depressed, Hopeless 0  PHQ - 2 Score 0     Other providers/specialists: Patient Care Team: Inda Coke, [place] as PCP - General (Physician Assistant)    PMHx, SurgHx, SocialHx, Medications, and Allergies were reviewed in the Visit Navigator and updated as appropriate.   Past Medical History:  Diagnosis Date   Allergy 2000   Amoxycillin, Seasonal Allergies   Anxiety 2021   Currently under care of licensed therapist   History of shingles    Vaginal delivery    2015, 2020     Past Surgical History:  Procedure Laterality Date   WISDOM TOOTH EXTRACTION  07/12/2007     Family History  Problem Relation Age of Onset   Arthritis Mother    Depression Mother    Hyperlipidemia Father    Hypertension Father    Arthritis Maternal Grandmother    Cancer Maternal Grandmother        stomach   Depression Maternal Grandmother    Diabetes Maternal Grandfather    Hearing loss Paternal Grandmother    Stroke Paternal Grandmother    Diabetes Paternal Grandfather    Hyperlipidemia Paternal Grandfather    Breast cancer Neg Hx    Colon cancer Neg Hx     Social History   Tobacco Use   Smoking status: Never   Smokeless tobacco: Never  Vaping Use   Vaping Use: Never used  Substance Use Topics   Alcohol use: Yes  Alcohol/week: 1.0 standard drink of alcohol    Types: 1 Glasses of wine per week    Comment: Once to twice a month   Drug use: Never    Review of Systems:   Review of Systems  Constitutional:  Negative for chills, fever, malaise/fatigue and weight loss.  HENT:  Negative for hearing loss, sinus pain and sore throat.   Respiratory:  Negative for cough and hemoptysis.   Cardiovascular:  Negative for chest pain, palpitations, leg swelling and PND.  Gastrointestinal:  Negative for abdominal pain, constipation, diarrhea, heartburn, nausea and vomiting.  Genitourinary:  Negative for dysuria, frequency and urgency.  Musculoskeletal:  Negative for back pain, myalgias and  neck pain.  Skin:  Negative for itching and rash.  Neurological:  Negative for dizziness, tingling, seizures and headaches.  Endo/Heme/Allergies:  Negative for polydipsia.  Psychiatric/Behavioral:  Negative for depression. The patient is not nervous/anxious.     Objective:   BP 90/60 (BP Location: Left Arm, Patient Position: Sitting, Cuff Size: Normal)   Pulse 78   Temp 97.8 F (36.6 C) (Temporal)   Ht 5' 6.5" (1.689 m)   Wt 128 lb 4 oz (58.2 kg)   LMP 08/03/2022 (Approximate)   SpO2 98%   BMI 20.39 kg/m  Body mass index is 20.39 kg/m.   General Appearance:    Alert, cooperative, no distress, appears stated age  Head:    Normocephalic, without obvious abnormality, atraumatic  Eyes:    PERRL, conjunctiva/corneas clear, EOM's intact, fundi    benign, both eyes  Ears:    Normal TM's and external ear canals, both ears  Nose:   Nares normal, septum midline, mucosa normal, no drainage    or sinus tenderness  Throat:   Lips, mucosa, and tongue normal; teeth and gums normal  Neck:   Supple, symmetrical, trachea midline, no adenopathy;    thyroid:  no enlargement/tenderness/nodules; no carotid   bruit or JVD  Back:     Symmetric, no curvature, ROM normal, no CVA tenderness  Lungs:     Clear to auscultation bilaterally, respirations unlabored  Chest Wall:    No tenderness or deformity   Heart:    Regular rate and rhythm, S1 and S2 normal, no murmur, rub or gallop  Breast Exam:    Deferred  Abdomen:     Soft, non-tender, bowel sounds active all four quadrants,    no masses, no organomegaly  Genitalia:    Deferred  Extremities:   Extremities normal, atraumatic, no cyanosis or edema  Pulses:   2+ and symmetric all extremities  Skin:   Skin color, texture, turgor normal, no rashes or lesions  Lymph nodes:   Cervical, supraclavicular, and axillary nodes normal  Neurologic:   CNII-XII intact, normal strength, sensation and reflexes    throughout    Assessment/Plan:   Routine  physical examination Today patient counseled on age appropriate routine health concerns for screening and prevention, each reviewed and up to date or declined. Immunizations reviewed and up to date or declined. Labs ordered and reviewed. Risk factors for depression reviewed and negative. Hearing function and visual acuity are intact. ADLs screened and addressed as needed. Functional ability and level of safety reviewed and appropriate. Education, counseling and referrals performed based on assessed risks today. Patient provided with a copy of personalized plan for preventive services.  Encounter for lipid screening for cardiovascular disease Update lipid panel and make recommendations accordingly.  I,Alexander Ruley,acting as a Education administrator for [employer], PA.,have documented all  relevant documentation on the behalf of Inda Coke, PA,as directed by  Inda Coke, PA while in the presence of Inda Coke, [place].  I, Inda Coke, [place], have reviewed all documentation for this visit. The documentation on 08/17/22 for the exam, diagnosis, procedures, and orders are all accurate and complete.  Inda Coke, PA-C [place]

## 2022-08-17 ENCOUNTER — Encounter: Payer: Self-pay | Admitting: Physician Assistant

## 2022-08-17 ENCOUNTER — Ambulatory Visit (INDEPENDENT_AMBULATORY_CARE_PROVIDER_SITE_OTHER): Payer: BC Managed Care – PPO | Admitting: Physician Assistant

## 2022-08-17 VITALS — BP 90/60 | HR 78 | Temp 97.8°F | Ht 66.5 in | Wt 128.2 lb

## 2022-08-17 DIAGNOSIS — Z136 Encounter for screening for cardiovascular disorders: Secondary | ICD-10-CM | POA: Diagnosis not present

## 2022-08-17 DIAGNOSIS — Z0000 Encounter for general adult medical examination without abnormal findings: Secondary | ICD-10-CM | POA: Diagnosis not present

## 2022-08-17 DIAGNOSIS — Z13220 Encounter for screening for lipoid disorders: Secondary | ICD-10-CM

## 2022-08-17 LAB — LIPID PANEL
Cholesterol: 199 mg/dL (ref 0–200)
HDL: 54.3 mg/dL (ref >39.00)
LDL Cholesterol: 119 mg/dL — ABNORMAL HIGH (ref 0–99)
NonHDL: 145.01
Total CHOL/HDL Ratio: 4
Triglycerides: 131 mg/dL (ref 0.0–149.0)
VLDL: 26.2 mg/dL (ref 0.0–40.0)

## 2022-08-17 LAB — COMPREHENSIVE METABOLIC PANEL
ALT: 18 U/L (ref 0–35)
AST: 17 U/L (ref 0–37)
Albumin: 4.2 g/dL (ref 3.5–5.2)
Alkaline Phosphatase: 55 U/L (ref 39–117)
BUN: 11 mg/dL (ref 6–23)
CO2: 27 mEq/L (ref 19–32)
Calcium: 9 mg/dL (ref 8.4–10.5)
Chloride: 106 mEq/L (ref 96–112)
Creatinine, Ser: 0.8 mg/dL (ref 0.40–1.20)
GFR: 94.36 mL/min (ref >60.00)
Glucose, Bld: 76 mg/dL (ref 70–99)
Potassium: 4.5 mEq/L (ref 3.5–5.1)
Sodium: 139 mEq/L (ref 135–145)
Total Bilirubin: 0.4 mg/dL (ref 0.2–1.2)
Total Protein: 6.5 g/dL (ref 6.0–8.3)

## 2022-08-17 LAB — CBC WITH DIFFERENTIAL/PLATELET
Basophils Absolute: 0 10*3/uL (ref 0.0–0.1)
Basophils Relative: 0.3 % (ref 0.0–3.0)
Eosinophils Absolute: 0.1 10*3/uL (ref 0.0–0.7)
Eosinophils Relative: 1.8 % (ref 0.0–5.0)
HCT: 38 % (ref 36.0–46.0)
Hemoglobin: 12.7 g/dL (ref 12.0–15.0)
Lymphocytes Relative: 32.5 % (ref 12.0–46.0)
Lymphs Abs: 1.9 10*3/uL (ref 0.7–4.0)
MCHC: 33.3 g/dL (ref 30.0–36.0)
MCV: 88.2 fl (ref 78.0–100.0)
Monocytes Absolute: 0.3 10*3/uL (ref 0.1–1.0)
Monocytes Relative: 4.7 % (ref 3.0–12.0)
Neutro Abs: 3.6 10*3/uL (ref 1.4–7.7)
Neutrophils Relative %: 60.7 % (ref 43.0–77.0)
Platelets: 155 10*3/uL (ref 150.0–400.0)
RBC: 4.31 Mil/uL (ref 3.87–5.11)
RDW: 13.9 % (ref 11.5–15.5)
WBC: 5.9 10*3/uL (ref 4.0–10.5)

## 2022-08-17 NOTE — Patient Instructions (Signed)
It was great to see you! ? ?Please go to the lab for blood work.  ? ?Our office will call you with your results unless you have chosen to receive results via MyChart. ? ?If your blood work is normal we will follow-up each year for physicals and as scheduled for chronic medical problems. ? ?If anything is abnormal we will treat accordingly and get you in for a follow-up. ? ?Take care, ? ?Melora Menon ?  ? ? ?

## 2023-04-27 ENCOUNTER — Telehealth: Payer: BC Managed Care – PPO | Admitting: Physician Assistant

## 2023-04-27 DIAGNOSIS — B3731 Acute candidiasis of vulva and vagina: Secondary | ICD-10-CM

## 2023-04-27 MED ORDER — FLUCONAZOLE 150 MG PO TABS: 150.0000 mg | ORAL_TABLET | Freq: Once | ORAL | 0 refills | Status: AC

## 2023-04-27 NOTE — Progress Notes (Signed)
I have spent 5 minutes in review of e-visit questionnaire, review and updating patient chart, medical decision making and response to patient.   Mia Milan Cody Jacklynn Dehaas, PA-C    

## 2023-04-27 NOTE — Progress Notes (Signed)

## 2023-05-02 ENCOUNTER — Ambulatory Visit (INDEPENDENT_AMBULATORY_CARE_PROVIDER_SITE_OTHER): Payer: BC Managed Care – PPO

## 2023-05-02 DIAGNOSIS — Z23 Encounter for immunization: Secondary | ICD-10-CM | POA: Diagnosis not present

## 2023-06-04 ENCOUNTER — Telehealth: Payer: BC Managed Care – PPO | Admitting: Nurse Practitioner

## 2023-06-04 DIAGNOSIS — B3731 Acute candidiasis of vulva and vagina: Secondary | ICD-10-CM

## 2023-06-04 MED ORDER — FLUCONAZOLE 150 MG PO TABS: 150.0000 mg | ORAL_TABLET | ORAL | 0 refills | Status: DC

## 2023-06-04 NOTE — Progress Notes (Signed)

## 2023-06-04 NOTE — Progress Notes (Signed)
I have spent 5 minutes in review of e-visit questionnaire, review and updating patient chart, medical decision making and response to patient.   Claiborne Rigg, NP

## 2023-06-16 ENCOUNTER — Ambulatory Visit: Payer: BC Managed Care – PPO | Admitting: Physician Assistant

## 2023-06-16 ENCOUNTER — Encounter: Payer: Self-pay | Admitting: Physician Assistant

## 2023-06-16 VITALS — BP 104/66 | Temp 97.5°F | Ht 66.5 in | Wt 118.6 lb

## 2023-06-16 DIAGNOSIS — R000 Tachycardia, unspecified: Secondary | ICD-10-CM | POA: Diagnosis not present

## 2023-06-16 DIAGNOSIS — G8929 Other chronic pain: Secondary | ICD-10-CM

## 2023-06-16 DIAGNOSIS — M545 Low back pain, unspecified: Secondary | ICD-10-CM | POA: Diagnosis not present

## 2023-06-16 DIAGNOSIS — R1011 Right upper quadrant pain: Secondary | ICD-10-CM

## 2023-06-16 LAB — COMPREHENSIVE METABOLIC PANEL
ALT: 18 U/L (ref 0–35)
AST: 16 U/L (ref 0–37)
Albumin: 4.7 g/dL (ref 3.5–5.2)
Alkaline Phosphatase: 53 U/L (ref 39–117)
BUN: 15 mg/dL (ref 6–23)
CO2: 26 meq/L (ref 19–32)
Calcium: 9.6 mg/dL (ref 8.4–10.5)
Chloride: 102 meq/L (ref 96–112)
Creatinine, Ser: 0.98 mg/dL (ref 0.40–1.20)
GFR: 73.54 mL/min (ref >60.00)
Glucose, Bld: 93 mg/dL (ref 70–99)
Potassium: 4.2 meq/L (ref 3.5–5.1)
Sodium: 137 meq/L (ref 135–145)
Total Bilirubin: 0.9 mg/dL (ref 0.2–1.2)
Total Protein: 7.6 g/dL (ref 6.0–8.3)

## 2023-06-16 LAB — CBC WITH DIFFERENTIAL/PLATELET
Basophils Absolute: 0 10*3/uL (ref 0.0–0.1)
Basophils Relative: 0.4 % (ref 0.0–3.0)
Eosinophils Absolute: 0.1 10*3/uL (ref 0.0–0.7)
Eosinophils Relative: 1.1 % (ref 0.0–5.0)
HCT: 42 % (ref 36.0–46.0)
Hemoglobin: 14.5 g/dL (ref 12.0–15.0)
Lymphocytes Relative: 26.9 % (ref 12.0–46.0)
Lymphs Abs: 1.5 10*3/uL (ref 0.7–4.0)
MCHC: 34.6 g/dL (ref 30.0–36.0)
MCV: 90.2 fL (ref 78.0–100.0)
Monocytes Absolute: 0.3 10*3/uL (ref 0.1–1.0)
Monocytes Relative: 5.1 % (ref 3.0–12.0)
Neutro Abs: 3.7 10*3/uL (ref 1.4–7.7)
Neutrophils Relative %: 66.5 % (ref 43.0–77.0)
Platelets: 151 10*3/uL (ref 150.0–400.0)
RBC: 4.66 Mil/uL (ref 3.87–5.11)
RDW: 13.4 % (ref 11.5–15.5)
WBC: 5.5 10*3/uL (ref 4.0–10.5)

## 2023-06-16 LAB — TSH: TSH: 1.13 u[IU]/mL (ref 0.35–5.50)

## 2023-06-16 NOTE — Progress Notes (Signed)
Angelica Dickerson is a 37 y.o. female here for a follow-up of a pre-existing problem. History of Present Illness:   Chief Complaint  Patient presents with   Back Pain    Dull achy pain in mid back starting at ribs and dull achy pain on right ribs to abdomen worse when eating feels like something stuck    HPI  Mid-back/Abdominal Pain: Complains of dull, achy pain in her mid-back starting at her ribs as well as similar pain on her anterior ribs down to her RUQ.    This issue was previously discussed January 2023, where she described her symptoms as intermittent discomfort under her anterior ribs, worsened after eating, which she had done stretches for with mild relief.   Today she reports that the pain has been more persistent, especially after eating when discomfort worsens an hour after consumption with a new symptom of constant achy mid-back pain that appeared a couple of months ago. States that her abdominal discomfort does not worsen every time she eats. She notes this back pain is not sore on palpation, and she has tried massages without relief.  Mentions that she noticed the pain more over Thanksgiving, and when eating pizza, but has not noticed any symptoms with alcohol consumption.   Reports that she does exercise, doing brisk walking and was doing some strength training before her nose surgery, but does not have any changes in symptoms.     Denies urinary symptoms, bowel symptoms or hematochezia, nausea, or weight changes.   Elevated Heart Rate: Her heart rate was elevated today while in office.  Hx of palpitations Is established with a cardiologist.  She reports that she hasn't eaten, had any coffee, and isn't anxious.  Denies chest pain, SOB, or recent upper respiratory symptoms.   Past Medical History:  Diagnosis Date   Allergy 2000   Amoxycillin, Seasonal Allergies   Anxiety 2021   Currently under care of licensed therapist   History of shingles    Vaginal  delivery    2015, 2020    Social History   Tobacco Use   Smoking status: Never   Smokeless tobacco: Never  Vaping Use   Vaping status: Never Used  Substance Use Topics   Alcohol use: Yes    Alcohol/week: 1.0 standard drink of alcohol    Types: 1 Glasses of wine per week    Comment: Once to twice a month   Drug use: Never   Past Surgical History:  Procedure Laterality Date   WISDOM TOOTH EXTRACTION  07/12/2007   Family History  Problem Relation Age of Onset   Arthritis Mother    Depression Mother    Hyperlipidemia Father    Hypertension Father    Arthritis Maternal Grandmother    Cancer Maternal Grandmother        stomach   Depression Maternal Grandmother    Diabetes Maternal Grandfather    Hearing loss Paternal Grandmother    Stroke Paternal Grandmother    Diabetes Paternal Grandfather    Hyperlipidemia Paternal Grandfather    Breast cancer Neg Hx    Colon cancer Neg Hx    Allergies  Allergen Reactions   Amoxicillin Hives and Itching   Current Medications:   Current Outpatient Medications:    fluconazole (DIFLUCAN) 150 MG tablet, Take 1 tablet (150 mg total) by mouth every other day., Disp: 2 tablet, Rfl: 0   Multiple Vitamin (MULTIVITAMIN) tablet, Take 1 tablet by mouth daily., Disp: , Rfl:   Review of  Systems:   ROS See pertinent positives and negatives as per the HPI.  Vitals:   Vitals:   06/16/23 1005  BP: 104/66  Temp: (!) 97.5 F (36.4 C)  TempSrc: Temporal  SpO2: 99%  Weight: 118 lb 9.6 oz (53.8 kg)  Height: 5' 6.5" (1.689 m)     Body mass index is 18.86 kg/m.  Physical Exam:   Physical Exam Vitals and nursing note reviewed.  Constitutional:      General: She is not in acute distress.    Appearance: She is well-developed. She is not ill-appearing or toxic-appearing.  Cardiovascular:     Rate and Rhythm: Regular rhythm. Tachycardia present.     Pulses: Normal pulses.     Heart sounds: Normal heart sounds, S1 normal and S2 normal.   Pulmonary:     Effort: Pulmonary effort is normal.     Breath sounds: Normal breath sounds.  Abdominal:     General: Abdomen is flat. Bowel sounds are normal.     Palpations: Abdomen is soft.     Tenderness: There is abdominal tenderness. There is no right CVA tenderness, left CVA tenderness or guarding.    Skin:    General: Skin is warm and dry.  Neurological:     Mental Status: She is alert.     GCS: GCS eye subscore is 4. GCS verbal subscore is 5. GCS motor subscore is 6.  Psychiatric:        Speech: Speech normal.        Behavior: Behavior normal. Behavior is cooperative.     Assessment and Plan:   Chronic right-sided low back pain without sciatica; Right upper quadrant abdominal pain Unclear etiology Differential diagnosis at this time includes gallbladder symptom(s), gastritis, peptic ulcer disease, IBS, constipation, among others Will order ultrasound and blood work or further evaluation and management  Consider CT abdomen/pelvis if unexplained or new/worsening sx   Tachycardia No red flags Asymptomatic Continue to monitor symptom(s) and close follow-up with cardiology if persists  I,Emily Lagle,acting as a Neurosurgeon for [employer], PA.,have documented all relevant documentation on the behalf of Jarold Motto, PA,as directed by  Jarold Motto, PA while in the presence of Jarold Motto, [place].  I, Jarold Motto, [place], have reviewed all documentation for this visit. The documentation on 06/16/23 for the exam, diagnosis, procedures, and orders are all accurate and complete.  Jarold Motto, PA-C

## 2023-06-20 ENCOUNTER — Ambulatory Visit
Admission: RE | Admit: 2023-06-20 | Discharge: 2023-06-20 | Disposition: A | Discharge status: Home / Self Care | Payer: BC Managed Care – PPO | Source: Ambulatory Visit | Attending: Physician Assistant | Admitting: Physician Assistant

## 2023-06-20 DIAGNOSIS — M545 Low back pain, unspecified: Secondary | ICD-10-CM

## 2023-08-21 ENCOUNTER — Ambulatory Visit (INDEPENDENT_AMBULATORY_CARE_PROVIDER_SITE_OTHER): Payer: BC Managed Care – PPO | Admitting: Physician Assistant

## 2023-08-21 ENCOUNTER — Encounter: Payer: Self-pay | Admitting: Physician Assistant

## 2023-08-21 VITALS — BP 120/80 | HR 80 | Temp 98.2°F | Ht 66.5 in | Wt 124.5 lb

## 2023-08-21 DIAGNOSIS — E785 Hyperlipidemia, unspecified: Secondary | ICD-10-CM | POA: Diagnosis not present

## 2023-08-21 DIAGNOSIS — R1011 Right upper quadrant pain: Secondary | ICD-10-CM

## 2023-08-21 DIAGNOSIS — Z0000 Encounter for general adult medical examination without abnormal findings: Secondary | ICD-10-CM

## 2023-08-21 DIAGNOSIS — R000 Tachycardia, unspecified: Secondary | ICD-10-CM

## 2023-08-21 LAB — LIPID PANEL
Cholesterol: 183 mg/dL (ref 0–200)
HDL: 69.6 mg/dL (ref >39.00)
LDL Cholesterol: 100 mg/dL — ABNORMAL HIGH (ref 0–99)
NonHDL: 113.03
Total CHOL/HDL Ratio: 3
Triglycerides: 63 mg/dL (ref 0.0–149.0)
VLDL: 12.6 mg/dL (ref 0.0–40.0)

## 2023-08-21 NOTE — Patient Instructions (Signed)
 It was great to see you!  Please go to the lab for blood work.   Our office will call you with your results unless you have chosen to receive results via MyChart.  If your blood work is normal we will follow-up each year for physicals and as scheduled for chronic medical problems.  If anything is abnormal we will treat accordingly and get you in for a follow-up.  Take care,  Lelon Mast

## 2023-08-21 NOTE — Progress Notes (Signed)
 Subjective:    Angelica Dickerson is a 38 y.o. female and is here for a comprehensive physical exam.  HPI  There are no preventive care reminders to display for this patient.   Acute Concerns: Abdominal Pain Her previous abdominal pain has significantly improved since last office visit. Ultrasound did not indicate any gallbladder stones. Physical therapy for diastasis recti has relieved her symptoms.  Denies any GI symptoms. No further concerns or symptoms at this time.  Chronic Issues: Palpitations She does report having a history of heart palpitation episodes. These episodes tend to occur about once a year or so. She recalls an episode of elevated heart pulse and chest pain that led her to the ER. 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 She states that these episode tend to happen after she stopped breastfeeding or a week prior to her menstrual cycles.  Denies any excessive caffeine intake.  She plans to follow up with her oby and cardiology to further evaluate the condition.  No concerns or symptoms at this time.  Anxiety Not on any medications at this time. She reports no longer following up with her therapist.  No concerns or symptoms at this time.  Health Maintenance: Immunizations -- N/A Colonoscopy -- N/A Mammogram -- N/A PAP -- last done on 08-21-20 Bone Density N/A Diet -- typical healthy, well balanced diet Exercise -- daily walks, strength training 3-4x a week.   Sleep habits -- well controlled Mood -- stable  UTD with dentist? - yes UTD with eye doctor? - yes  Weight history: Wt Readings from Last 10 Encounters:  08/21/23 124 lb 8 oz (56.5 kg)  06/16/23 118 lb 9.6 oz (53.8 kg)  08/17/22 128 lb 4 oz (58.2 kg)  12/01/21 127 lb 3.2 oz (57.7 kg)  11/29/21 120 lb (54.4 kg)  08/04/21 121 lb 4 oz (55 kg)  08/21/20 120 lb 6.4 oz (54.6 kg)  03/30/20 121 lb 6.4 oz (55.1 kg)  08/31/18 130 lb (59 kg)   07/29/18 148 lb (67.1 kg)   Body mass index is 19.79 kg/m. Patient's last menstrual period was 07/31/2023 (exact date).  Alcohol use:  reports that she does not currently use alcohol after a past usage of about 1.0 standard drink of alcohol per week.  Tobacco use:  Tobacco Use: Low Risk  (08/21/2023)   Patient History    Smoking Tobacco Use: Never    Smokeless Tobacco Use: Never    Passive Exposure: Not on file   Eligible for lung cancer screening? no     08/21/2023    9:44 AM  Depression screen PHQ 2/9  Decreased Interest 0  Down, Depressed, Hopeless 0  PHQ - 2 Score 0     Other providers/specialists: Patient Care Team: Alexander Iba, [place] as PCP - General (Physician Assistant)    PMHx, SurgHx, SocialHx, Medications, and Allergies were reviewed in the Visit Navigator and updated as appropriate.   Past Medical History:  Diagnosis Date   Allergy 2000   Amoxycillin, Seasonal Allergies   Anxiety 2021   Currently under care of licensed therapist   History of shingles    Vaginal delivery    2015, 2020     Past Surgical History:  Procedure Laterality Date   WISDOM TOOTH EXTRACTION  07/12/2007     Family History  Problem Relation Age of Onset   Arthritis Mother    Depression Mother    Hyperlipidemia Father  Hypertension Father    Arthritis Maternal Grandmother    Cancer Maternal Grandmother        stomach   Depression Maternal Grandmother    Diabetes Maternal Grandfather    Hearing loss Paternal Grandmother    Stroke Paternal Grandmother    Varicose Veins Paternal Grandmother    Diabetes Paternal Grandfather    Hyperlipidemia Paternal Grandfather    Breast cancer Neg Hx    Colon cancer Neg Hx     Social History   Tobacco Use   Smoking status: Never   Smokeless tobacco: Never  Vaping Use   Vaping status: Never Used  Substance Use Topics   Alcohol use: Not Currently    Alcohol/week: 1.0 standard drink of alcohol    Comment: Once to twice a  month   Drug use: Never    Review of Systems:   Review of Systems  Constitutional:  Negative for chills, fever, malaise/fatigue and weight loss.  HENT:  Negative for hearing loss, sinus pain and sore throat.   Respiratory:  Negative for cough and hemoptysis.   Cardiovascular:  Negative for chest pain, palpitations, leg swelling and PND.  Gastrointestinal: Negative.  Negative for abdominal pain, constipation, diarrhea, heartburn, nausea and vomiting.  Genitourinary:  Negative for dysuria, frequency and urgency.  Musculoskeletal:  Negative for back pain, myalgias and neck pain.  Skin:  Negative for itching and rash.  Neurological:  Negative for dizziness, tingling, seizures and headaches.  Endo/Heme/Allergies:  Negative for polydipsia.  Psychiatric/Behavioral:  Negative for depression. The patient is not nervous/anxious.     Objective:   BP 120/80 (BP Location: Left Arm, Patient Position: Sitting, Cuff Size: Normal)   Pulse 80   Temp 98.2 F (36.8 C) (Temporal)   Ht 5' 6.5" (1.689 m)   Wt 124 lb 8 oz (56.5 kg)   LMP 07/31/2023 (Exact Date)   SpO2 97%   BMI 19.79 kg/m  Body mass index is 19.79 kg/m.   General Appearance:    Alert, cooperative, no distress, appears stated age  Head:    Normocephalic, without obvious abnormality, atraumatic  Eyes:    PERRL, conjunctiva/corneas clear, EOM's intact, fundi    benign, both eyes  Ears:    Normal TM's and external ear canals, both ears  Nose:   Nares normal, septum midline, mucosa normal, no drainage    or sinus tenderness  Throat:   Lips, mucosa, and tongue normal; teeth and gums normal  Neck:   Supple, symmetrical, trachea midline, no adenopathy;    thyroid:  no enlargement/tenderness/nodules; no carotid   bruit or JVD  Back:     Symmetric, no curvature, ROM normal, no CVA tenderness  Lungs:     Clear to auscultation bilaterally, respirations unlabored  Chest Wall:    No tenderness or deformity   Heart:    Regular rate and  rhythm, S1 and S2 normal, no murmur, rub or gallop  Breast Exam:    Deferred  Abdomen:     Soft, non-tender, bowel sounds active all four quadrants,    no masses, no organomegaly  Genitalia:    Deferred  Extremities:   Extremities normal, atraumatic, no cyanosis or edema  Pulses:   2+ and symmetric all extremities  Skin:   Skin color, texture, turgor normal, no rashes or lesions  Lymph nodes:   Cervical, supraclavicular, and axillary nodes normal  Neurologic:   CNII-XII intact, normal strength, sensation and reflexes    throughout    Assessment/Plan:  Routine physical examination Today patient counseled on age appropriate routine health concerns for screening and prevention, each reviewed and up to date or declined. Immunizations reviewed and up to date or declined. Labs ordered and reviewed. Risk factors for depression reviewed and negative. Hearing function and visual acuity are intact. ADLs screened and addressed as needed. Functional ability and level of safety reviewed and appropriate. Education, counseling and referrals performed based on assessed risks today. Patient provided with a copy of personalized plan for preventive services.  Tachycardia Overall stable per patient Declines need for specialist or other intervention Reach out if new/worsening/other concerns Continue to monitor  Hyperlipidemia, unspecified hyperlipidemia type Update lipid panel  Right upper quadrant abdominal pain Overall improved due to recent physical therapy Reach out if new/worsening/other concerns    Alexander Iba, PA-C [facility] M Kadhim,acting as a Neurosurgeon for [employer], PA.,have documented all relevant documentation on the behalf of Alexander Iba, PA,as directed by  Alexander Iba, PA while in the presence of Alexander Iba, [place].   I, Alexander Iba, [place], have reviewed all documentation for this visit. The documentation on 08/21/23 for the exam, diagnosis,  procedures, and orders are all accurate and complete.

## 2023-08-22 ENCOUNTER — Encounter: Payer: Self-pay | Admitting: Physician Assistant

## 2024-05-23 ENCOUNTER — Telehealth: Payer: Self-pay | Admitting: Cardiovascular Disease

## 2024-05-23 NOTE — Telephone Encounter (Signed)
 Pt is requesting a provider switch from Dr. Barbaraann to Dr. Lonni due to her wanting to see a female provider. Please confirm.

## 2024-08-14 NOTE — Progress Notes (Signed)
 " Cardiology Office Note:  .   Date:  08/15/2024  ID:  Angelica Dickerson, DOB 03/15/1986, MRN [id number] PCP: Job Lukes, PA  [facility] Providers Cardiologist:  Shelda Bruckner, MD {  History of Present Illness: .   Angelica Dickerson is a 39 y.o. female with PMH palpitations. She was previously followed by Dr. Barbaraann but has not been seen since 2023.  Pertinent CV history: seen in 2023 for palpitations. Workup unremarkable, apple watch tracing inconclusive per Dr. Barbaraann. Echo with 60-65%, no abnormalities.  Today: At her last gyn visit, noted history of palpitations, cholesterol issues. Tried oral contraceptive, palpitations were much worse for the two weeks she was taking it. Stopped this before Thanksgiving. Since then, palpitations are worst about 1.5-2 weeks before her period, last for a few days, then will be almost nonexistent. On the worst days, feels like her heart races, feels off, can last all day. Nothing seems to make these better or worse when they are occurring. Can happen when laying in bed. Doesn't drink much caffeine, 1 cup of coffee in the AM, can make palpitations worse. Was better when she tried lower caffeine tea in the morning.   FH: maternal grandfather had heart murmur. Paternal grandfather, paternal uncle have afib. Father has hypertension. Mother without heart issues.   Had an episode of chest tightness and fast heart rate in 06/2023, went to ER for evaluation, workup unremarkable.  Fairly active at baseline, no limitations. Walks the dog a few miles/day. Does strength training when she can.  ROS: Denies other chest pain, shortness of breath at rest or with normal exertion. No PND, orthopnea, LE edema or unexpected weight gain. No syncope. ROS otherwise negative except as noted.   Studies Reviewed: SABRA    EKG:  EKG Interpretation Date/Time:  Thursday August 15 2024 09:37:41 EST Ventricular Rate:  94 PR Interval:  140 QRS  Duration:  92 QT Interval:  352 QTC Calculation: 440 R Axis:   81  Text Interpretation: Normal sinus rhythm Right atrial enlargement Confirmed by Bruckner Shelda [phone]) on 08/15/2024 10:01:43 AM    Physical Exam:   VS:  BP 116/70 (BP Location: Left Arm, Patient Position: Sitting, Cuff Size: Normal)   Pulse 94   Ht 5' 6 (1.676 m)   Wt 126 lb (57.2 kg)   SpO2 98%   BMI 20.34 kg/m    Wt Readings from Last 3 Encounters:  08/15/24 126 lb (57.2 kg)  08/21/23 124 lb 8 oz (56.5 kg)  06/16/23 118 lb 9.6 oz (53.8 kg)    GEN: Well nourished, well developed in no acute distress HEENT: Normal, moist mucous membranes NECK: No JVD CARDIAC: regular rhythm, normal S1 and S2, no rubs or gallops. No murmur. VASCULAR: Radial and DP pulses 2+ bilaterally. No carotid bruits RESPIRATORY:  Clear to auscultation without rales, wheezing or rhonchi  ABDOMEN: Soft, non-tender, non-distended MUSCULOSKELETAL:  Ambulates independently SKIN: Warm and dry, no edema NEUROLOGIC:  Alert and oriented x 3. No focal neuro deficits noted. PSYCHIATRIC:  Normal affect    ASSESSMENT AND PLAN: .    Palpitations Intermittent tachycardia -echo normal in 2023 with similar symptoms -will get 2 week Zio, aimed at when she is having the worst symptoms, to capture that and also normal events -discussed potential findings on Zio, including normal amount of early beats, association potentially between symptoms and rhythms, what it means if rhythm is sinus during symptoms -reviewed red flag warning signs that need immediate medical attention  CV risk counseling and prevention -recommend heart healthy/Mediterranean diet, with whole grains, fruits, vegetable, fish, lean meats, nuts, and olive oil. Limit salt. -recommend moderate walking, 3-5 times/week for 30-50 minutes each session. Aim for at least 150 minutes/week. Goal should be pace of 3 miles/hours, or walking 1.5 miles in 30 minutes -recommend avoidance of tobacco  products. Avoid excess alcohol. -ASCVD risk score: The ASCVD Risk score (Arnett DK, et al., 2019) failed to calculate for the following reasons:   The 2019 ASCVD risk score is only valid for ages 47 to 17    Reviewed her cholesterol from 08/21/23: LDL 100, HDL excellent at 69, TG excellent at 63. From primary prevention standpoint, this is at goal.  Dispo: if monitor unremarkable, I am happy to see her back as needed  Signed, Shelda Bruckner, MD   Shelda Bruckner, MD, PhD, [facility]  [facility]  [place]  [facility] at [facility] at [facility] [address] [place], [place] [address] [phone]   "

## 2024-08-15 ENCOUNTER — Encounter (HOSPITAL_BASED_OUTPATIENT_CLINIC_OR_DEPARTMENT_OTHER): Payer: Self-pay | Admitting: Cardiology

## 2024-08-15 ENCOUNTER — Ambulatory Visit

## 2024-08-15 ENCOUNTER — Ambulatory Visit (HOSPITAL_BASED_OUTPATIENT_CLINIC_OR_DEPARTMENT_OTHER): Admitting: Cardiology

## 2024-08-15 VITALS — BP 116/70 | HR 94 | Ht 66.0 in | Wt 126.0 lb

## 2024-08-15 DIAGNOSIS — Z7189 Other specified counseling: Secondary | ICD-10-CM | POA: Diagnosis not present

## 2024-08-15 DIAGNOSIS — R002 Palpitations: Secondary | ICD-10-CM | POA: Diagnosis not present

## 2024-08-15 DIAGNOSIS — R000 Tachycardia, unspecified: Secondary | ICD-10-CM | POA: Diagnosis not present

## 2024-08-15 NOTE — Patient Instructions (Addendum)
 We will get a 2 week monitor. I want you place it 1-2 days before when you would usually expect to have symptoms in your cycle, and then wear it for a total of 2 weeks.  Medication Instructions:  No changes *If you need a refill on your cardiac medications before your next appointment, please call your pharmacy*  Lab Work: None  Testing/Procedures: Zio Heart Monitor (patch)  Follow-Up: To be decided based on results  Other Instructions ZIO XT- Long Term Monitor Instructions  Your physician has requested you wear a ZIO patch monitor for 14 days.  This is a single patch monitor. [employer] supplies one patch monitor per enrollment. Additional stickers are not available. Please do not apply patch if you will be having a Nuclear Stress Test,  Echocardiogram, Cardiac CT, MRI, or Chest Xray during the period you would be wearing the  monitor. The patch cannot be worn during these tests. You cannot remove and re-apply the  ZIO XT patch monitor.  Your ZIO patch monitor will be mailed 3 day USPS to your address on file. It may take 3-5 days  to receive your monitor after you have been enrolled.  Once you have received your monitor, please review the enclosed instructions. Your monitor  has already been registered assigning a specific monitor serial # to you.  Billing and Patient Assistance Program Information  We have supplied [employer] with any of your insurance information on file for billing purposes. [employer] offers a sliding scale Patient Assistance Program for patients that do not have  insurance, or whose insurance does not completely cover the cost of the ZIO monitor.  You must apply for the Patient Assistance Program to qualify for this discounted rate.  To apply, please call [employer] at [phone], select option 4, select option 2, ask to apply for  Patient Assistance Program. Meredeth will ask your household income, and how many people  are in your household. They will quote your  out-of-pocket cost based on that information.  [employer] will also be able to set up a 66-month, interest-free payment plan if needed.  Applying the monitor Hold abrader disc by orange tab. Rub abrader in 40 strokes over the upper left chest as  indicated in your monitor instructions.  Clean area with 4 enclosed alcohol pads. Let dry.  Apply patch as indicated in monitor instructions. Patch will be placed under collarbone on left  side of chest with arrow pointing upward.  Rub patch adhesive wings for 2 minutes. Remove white label marked 1. Remove the white  label marked 2. Rub patch adhesive wings for 2 additional minutes.  While looking in a mirror, press and release button in center of patch. A small green light will  flash 3-4 times. This will be your only indicator that the monitor has been turned on.  Do not shower for the first 24 hours. You may shower after the first 24 hours.  Press the button if you feel a symptom. You will hear a small click. Record Date, Time and  Symptom in the Patient Logbook.  When you are ready to remove the patch, follow instructions on the last 2 pages of Patient  Logbook. Stick patch monitor onto the last page of Patient Logbook.  Place Patient Logbook in the blue and white box. Use locking tab on box and tape box closed  securely. The blue and white box has prepaid postage on it. Please place it in the mailbox as  soon as possible. Your physician  should have your test results approximately 7 days after the  monitor has been mailed back to [facility].  Call [facility] Customer Care at [phone] if you have questions regarding  your ZIO XT patch monitor. Call them immediately if you see an orange light blinking on your  monitor.  If your monitor falls off in less than 4 days, contact our Monitor department at [phone].  If your monitor becomes loose or falls off after 4 days call [employer] at [phone] for  suggestions on securing  your monitor

## 2024-08-15 NOTE — Progress Notes (Unsigned)
 Enrolled patient for a 14 day Zio XT  monitor to be mailed to patients home

## 2024-08-22 ENCOUNTER — Encounter: Payer: BC Managed Care – PPO | Admitting: Physician Assistant
